# Patient Record
Sex: Male | Born: 1970 | Race: White | Hispanic: No | Marital: Married | Smoking: Never smoker
Health system: Southern US, Community
[De-identification: ages and names within clinical notes are randomized; demographics above are authoritative.]

## PROBLEM LIST (undated history)

## (undated) DIAGNOSIS — J302 Other seasonal allergic rhinitis: Secondary | ICD-10-CM

## (undated) DIAGNOSIS — T884XXA Failed or difficult intubation, initial encounter: Secondary | ICD-10-CM

## (undated) DIAGNOSIS — G4730 Sleep apnea, unspecified: Secondary | ICD-10-CM

## (undated) DIAGNOSIS — M5020 Other cervical disc displacement, unspecified cervical region: Secondary | ICD-10-CM

## (undated) DIAGNOSIS — J310 Chronic rhinitis: Secondary | ICD-10-CM

## (undated) HISTORY — PX: WISDOM TOOTH EXTRACTION: SHX21

## (undated) HISTORY — PX: NASAL SINUS SURGERY: SHX719

## (undated) HISTORY — PX: SHOULDER SURGERY: SHX246

## (undated) HISTORY — PX: COLONOSCOPY: SHX174

---

## 2008-10-01 ENCOUNTER — Ambulatory Visit: Payer: Self-pay | Admitting: Otolaryngology

## 2008-11-01 ENCOUNTER — Ambulatory Visit: Payer: Self-pay | Admitting: Otolaryngology

## 2009-03-22 ENCOUNTER — Ambulatory Visit: Payer: Self-pay | Admitting: Otolaryngology

## 2009-10-07 ENCOUNTER — Emergency Department: Payer: Self-pay | Admitting: Emergency Medicine

## 2009-10-13 ENCOUNTER — Emergency Department: Payer: Self-pay | Admitting: Emergency Medicine

## 2009-12-09 ENCOUNTER — Ambulatory Visit: Payer: Self-pay | Admitting: Otolaryngology

## 2010-01-08 ENCOUNTER — Ambulatory Visit: Payer: Self-pay | Admitting: Otolaryngology

## 2012-05-27 ENCOUNTER — Ambulatory Visit: Payer: Self-pay | Admitting: Internal Medicine

## 2012-06-10 ENCOUNTER — Ambulatory Visit: Payer: Self-pay | Admitting: Gastroenterology

## 2012-06-13 LAB — PATHOLOGY REPORT

## 2012-06-20 ENCOUNTER — Ambulatory Visit: Payer: Self-pay | Admitting: Gastroenterology

## 2012-06-22 HISTORY — PX: CHOLECYSTECTOMY: SHX55

## 2012-09-02 ENCOUNTER — Ambulatory Visit: Payer: Self-pay | Admitting: Surgery

## 2012-09-05 LAB — PATHOLOGY REPORT

## 2013-05-11 IMAGING — US ABDOMEN ULTRASOUND
2 series · 13 of 25 positions shown · non-contrast
Comparison: none

REASON FOR EXAM: Epigastric discomfort  elevated liver function test
COMMENTS:

PROCEDURE:     US  - US ABDOMEN GENERAL SURVEY  - May 27, 2012  [DATE]
RESULT:     Comparison: None
TECHNIQUE: Multiple gray-scale and color-flow Doppler images of the abdomen
are presented for review.

[Series 1: abdomen ultrasound · 0.31mm/px · 12 of 83 slices shown (1 of 2)]
[im 1/83]
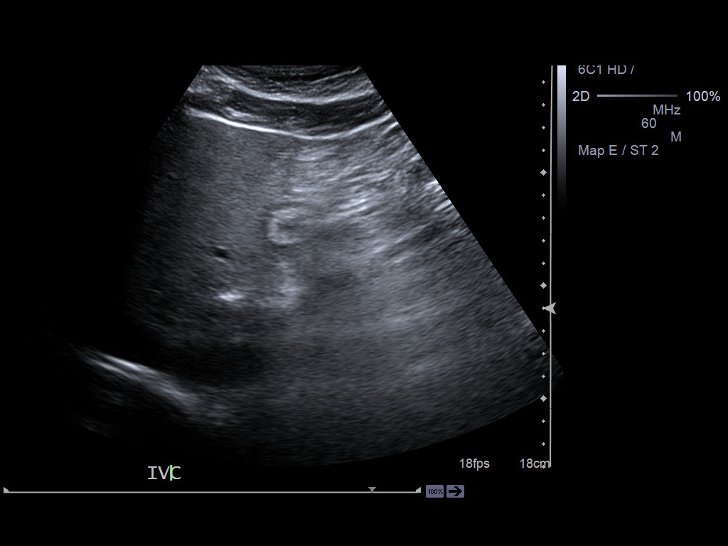
[im 8/83]
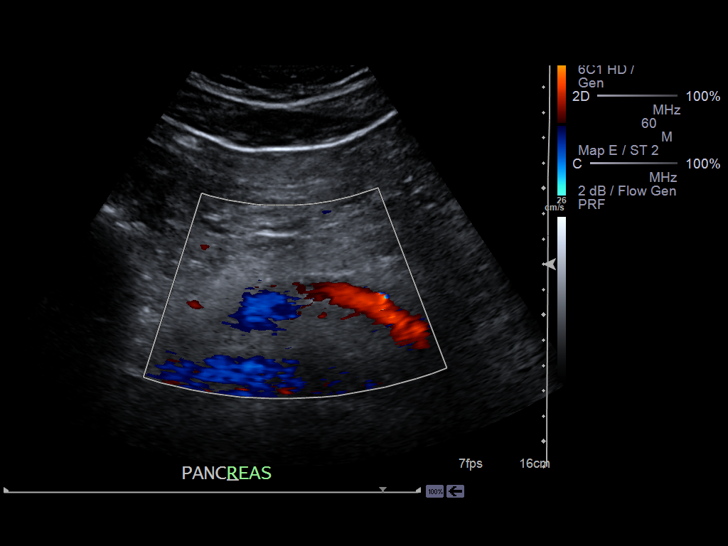
[im 15/83]
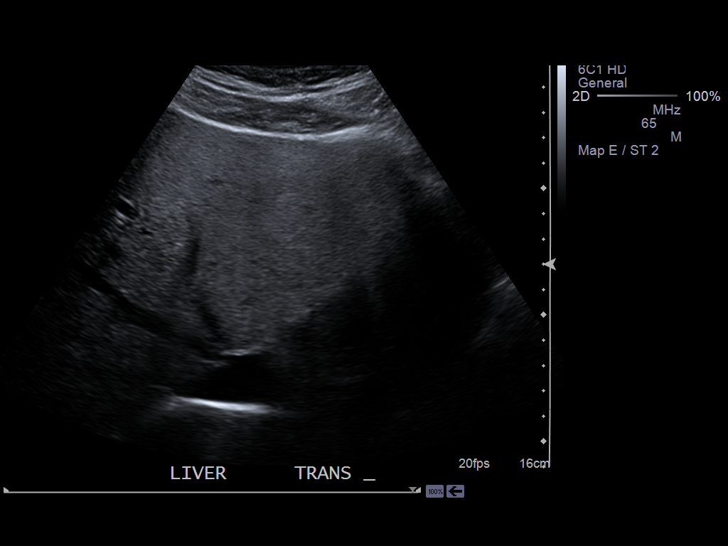
[im 22/83]
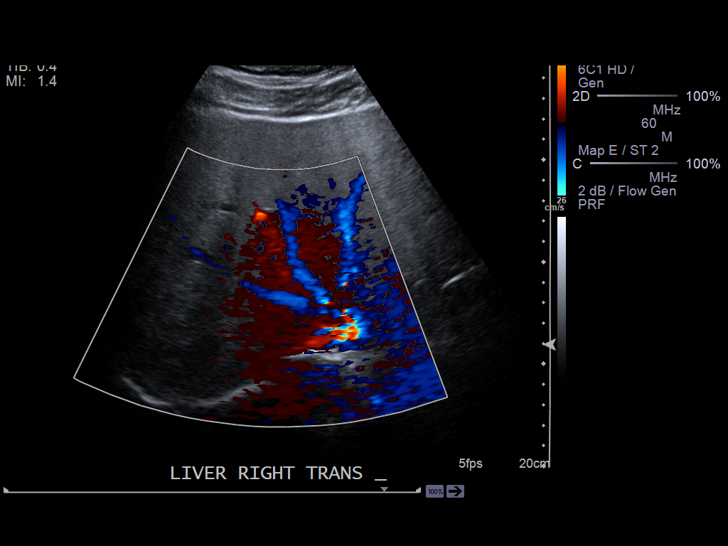
[im 29/83]
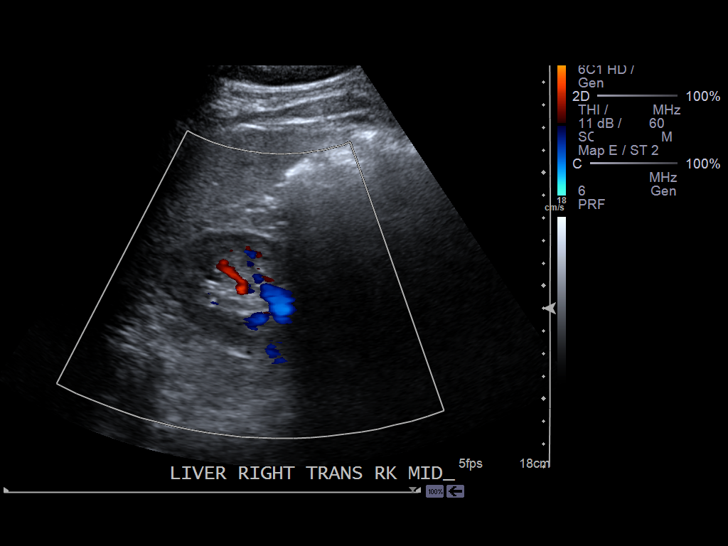
[im 36/83]
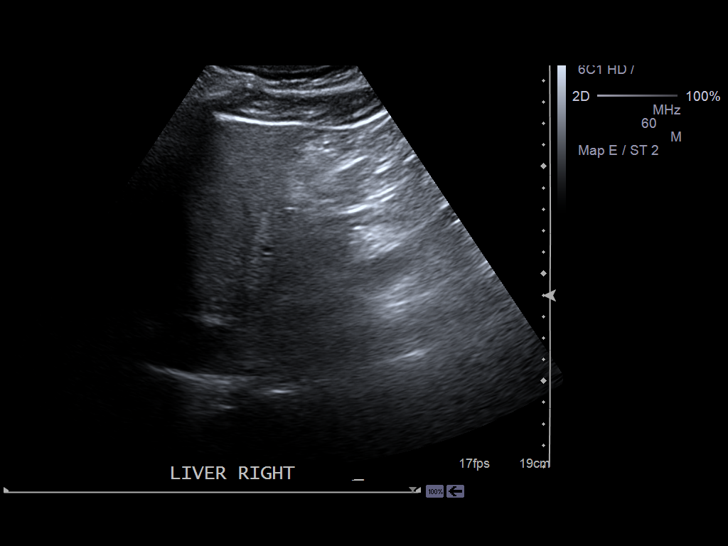
[im 43/83]
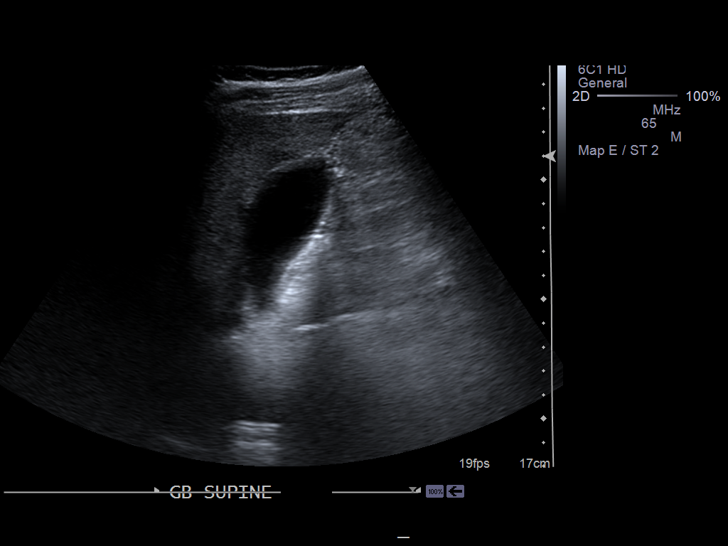
[im 50/83]
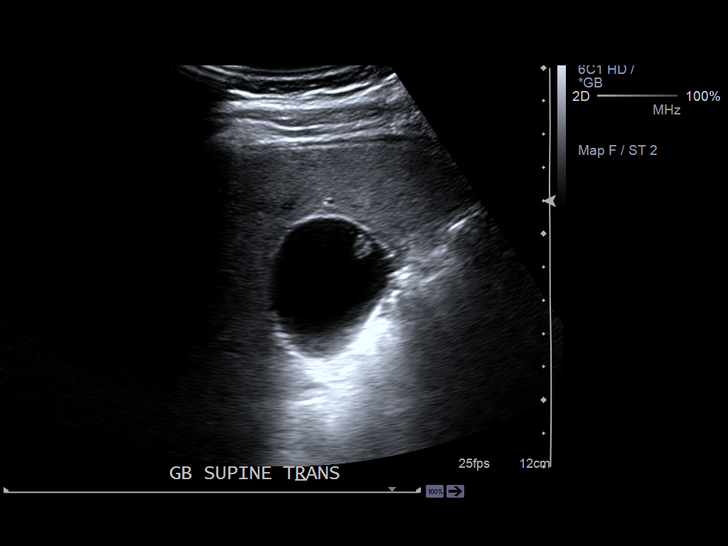
[im 58/83]
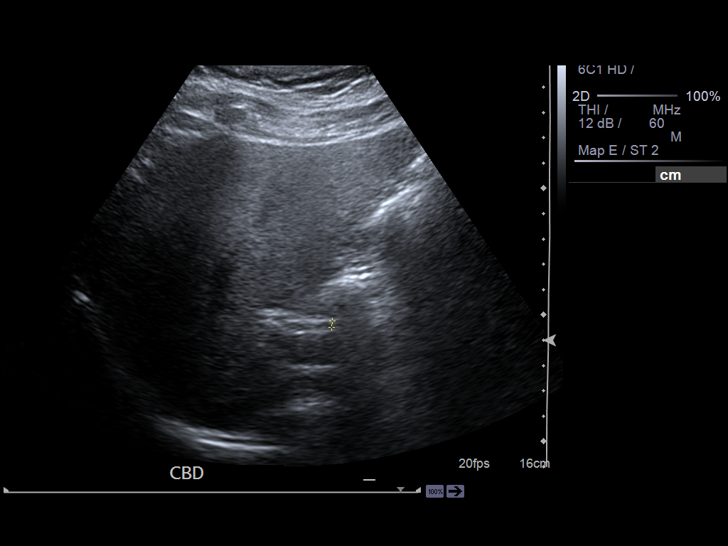
[im 65/83]
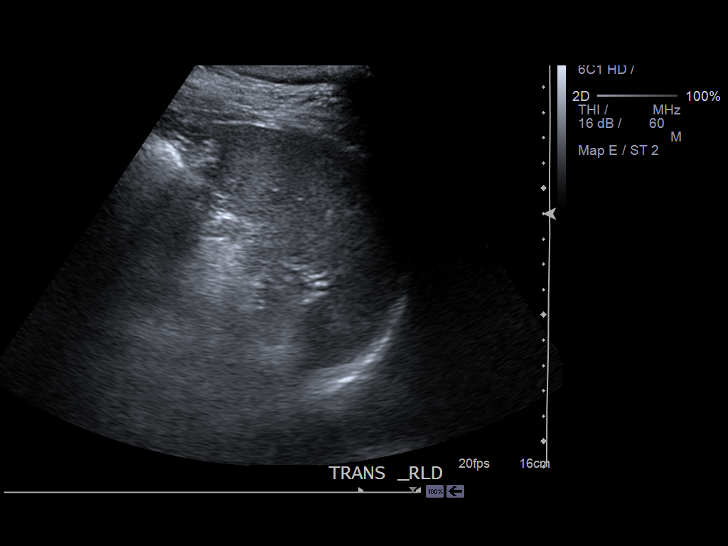
[im 72/83]
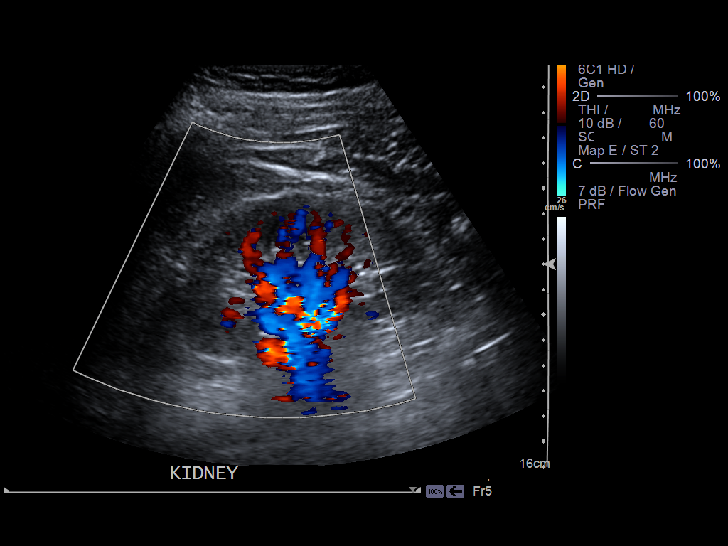
[im 79/83]
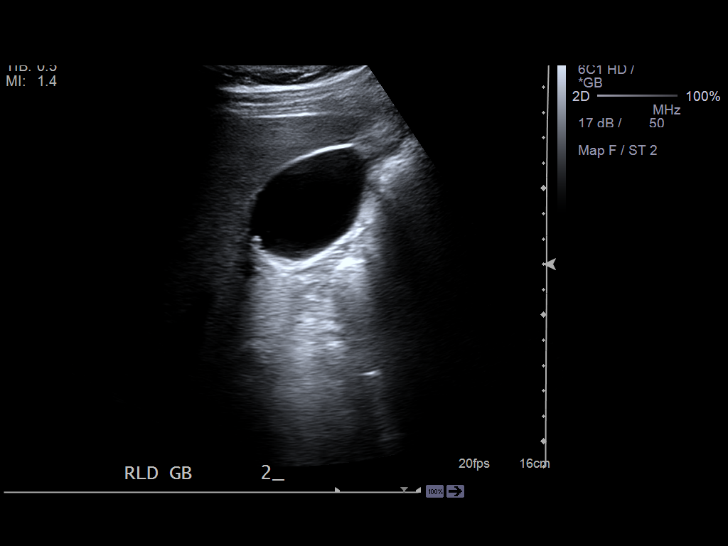

[Series 3: abdomen ultrasound · 0.25mm/px · 1 of 1 slices shown (2 of 2)]
[im 1/1]
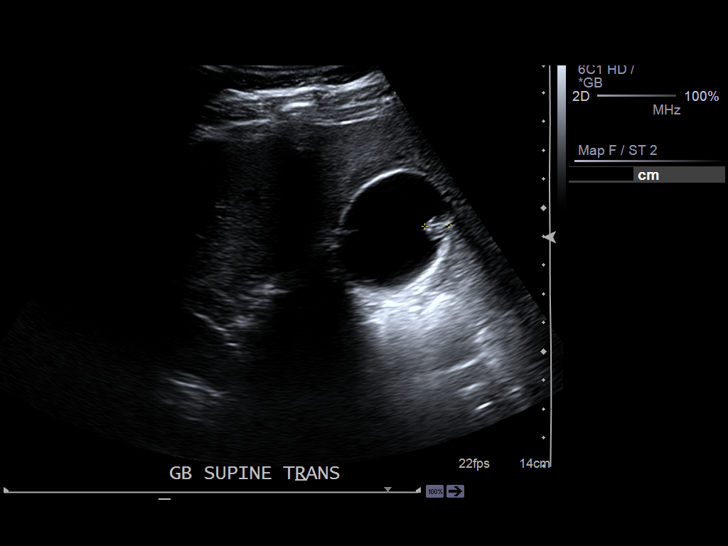

[13 of 25 positions shown; findings below may reference images not displayed]

FINDINGS: The liver is increased in echogenicity as can be seen with hepatic
steatosis. The liver is without evidence of a focal hepatic lesion.

There is no cholelithiasis or biliary sludge. The there are 2 nonmobile
echogenic foci along the nondependent aspect of the gallbladder measuring 5
mm and 9 mm respectively . There is no intra- or extrahepatic biliary ductal
dilatation. The common duct measures 2.5 mm in maximal diameter. There is no
gallbladder wall thickening, pericholecystic fluid, or sonographic Murphy's
sign.

The visualized portion of the pancreas is normal in echogenicity. The spleen
is unremarkable. Bilateral kidneys are normal in echogenicity and size. The
right kidney measures 12.4 x 4.6 x 5.8 cm. The left kidney measures 12.6 x
5.1 x 5.2 cm. There are no renal calculi or hydronephrosis. The abdominal
aorta and IVC are unremarkable.
IMPRESSION: 1. The there are 2 nonmobile echogenic foci along the nondependent aspect of
the gallbladder measuring 5 mm and 9 mm respectively a concerning for a
gallbladder polyps versus adherent gallstones. At the very least surgical
consultation and ultrasound followup in 3-6 months is recommended.

2. No cholelithiasis or sonographic evidence of acute cholecystitis.

[REDACTED]

## 2014-02-10 ENCOUNTER — Emergency Department: Payer: Self-pay | Admitting: Emergency Medicine

## 2014-10-12 NOTE — Op Note (Signed)
PATIENT NAME:  Anthony Gallegos, Anthony Gallegos MR#:  [id number] DATE OF BIRTH:  09-09-1970  DATE OF PROCEDURE:  09/02/2012  PREOPERATIVE DIAGNOSIS: Chronic cholecystitis.   POSTOPERATIVE DIAGNOSIS: Chronic cholecystitis, cholelithiasis.   PROCEDURE: Laparoscopic cholecystectomy, cholangiogram.   SURGEON: Rochel Brome, MD  ANESTHESIA: General.   INDICATIONS: This 43 year old male has history of protracted nausea and ultrasound findings of particulate debris within the gallbladder suggestive of either stones or small polyps. Surgery was recommended for definitive treatment.   DESCRIPTION OF PROCEDURE: The patient was placed on the operating table, in the supine position, under general anesthesia. The abdomen was clipped and prepared with ChloraPrep and draped in a sterile manner.   A short incision was made in the inferior aspect of the umbilicus and carried down to the deep fascia which was grasped with laryngeal hook and elevated. A Veress needle was inserted, aspirated, and irrigated with a saline solution. The peritoneal cavity was inflated with carbon dioxide. The Veress needle was removed. The 10 mm cannula was inserted. The 10 mm 0 degree laparoscope was inserted to view the peritoneal cavity. The liver appeared normal. Another incision was made in the epigastrium slightly to the right of the midline to introduce another 10 mm cannula. Two incisions were made in the lateral aspect of the right upper quadrant to introduce two 5 mm cannulas.   The gallbladder was retracted towards the right shoulder. The infundibulum was retracted inferiorly and laterally. The porta hepatis was demonstrated. The neck of the gallbladder was mobilized with incision of the visceral peritoneum. The cystic duct and cystic artery were each dissected free from surrounding structures. A critical view of safety was demonstrated. The cystic artery was controlled with double endoclips  and divided. This allowed better traction on the  cystic duct. Next, an Endo Clip was placed across the cystic duct adjacent to the neck of the gallbladder. An incision was made in the cystic duct to introduce a Reddick catheter. Half-strength Conray-60 dye was injected as the cholangiogram was done with fluoroscopy viewing the biliary tree and prompt flow of dye into the duodenum.  No retained stones were seen. The Reddick catheter was removed. The cystic duct was doubly ligated with endoclips and divided. The gallbladder was dissected free from the liver with hook and cautery. The gallbladder was completely separated. The site was irrigated with a saline solution and aspirated. Hemostasis was intact. The gallbladder was delivered up through the infraumbilical incision, opened, suctioned and removed, and was further opened and demonstrated fine particulate debris within the gallbladder consistent with tiny stones. The gallbladder was submitted in formalin for routine pathology. The right upper quadrant was further inspected. Hemostasis was intact. The cannulas were removed allowing carbon dioxide to escape from the peritoneal cavity. The umbilical defect was closed with a 0 Vicryl simple suture. The skin incisions were closed with interrupted 5-0 chromic subcuticular sutures, benzoin, and Steri-Strips. Dressings were applied with paper tape. The patient tolerated surgery satisfactorily and was prepared for transfer to the recovery room.  ____________________________ Lenna Sciara. Rochel Brome, MD jws:sb D: 09/02/2012 10:47:58 ET T: 09/02/2012 11:20:00 ET JOB#: [id number]  cc: Loreli Dollar, MD, <Dictator> Loreli Dollar MD ELECTRONICALLY SIGNED 09/04/2012 21:22

## 2015-07-19 ENCOUNTER — Ambulatory Visit: Payer: Self-pay | Admitting: Physician Assistant

## 2015-07-23 ENCOUNTER — Encounter (HOSPITAL_COMMUNITY)
Admission: RE | Admit: 2015-07-23 | Discharge: 2015-07-23 | Disposition: A | Discharge status: Home / Self Care | Payer: Worker's Compensation | Source: Ambulatory Visit | Attending: Orthopedic Surgery | Admitting: Orthopedic Surgery

## 2015-07-23 ENCOUNTER — Encounter (HOSPITAL_COMMUNITY): Payer: Self-pay

## 2015-07-23 DIAGNOSIS — M50223 Other cervical disc displacement at C6-C7 level: Secondary | ICD-10-CM | POA: Diagnosis present

## 2015-07-23 DIAGNOSIS — M50123 Cervical disc disorder at C6-C7 level with radiculopathy: Secondary | ICD-10-CM | POA: Diagnosis not present

## 2015-07-23 HISTORY — DX: Sleep apnea, unspecified: G47.30

## 2015-07-23 HISTORY — DX: Other cervical disc displacement, unspecified cervical region: M50.20

## 2015-07-23 HISTORY — DX: Failed or difficult intubation, initial encounter: T88.4XXA

## 2015-07-23 HISTORY — DX: Other seasonal allergic rhinitis: J30.2

## 2015-07-23 LAB — CBC
HEMATOCRIT: 44.8 % (ref 39.0–52.0)
Hemoglobin: 15.9 g/dL (ref 13.0–17.0)
MCH: 30.9 pg (ref 26.0–34.0)
MCHC: 35.5 g/dL (ref 30.0–36.0)
MCV: 87 fL (ref 78.0–100.0)
Platelets: 215 10*3/uL (ref 150–400)
RBC: 5.15 MIL/uL (ref 4.22–5.81)
RDW: 12.8 % (ref 11.5–15.5)
WBC: 5.2 10*3/uL (ref 4.0–10.5)

## 2015-07-23 LAB — SURGICAL PCR SCREEN
MRSA, PCR: NEGATIVE
STAPHYLOCOCCUS AUREUS: POSITIVE — AB

## 2015-07-23 NOTE — Progress Notes (Signed)
Anesthesia PAT Evaluation: Patient is a 45 year old male scheduled for C5-7 anterior disc arthroplasty on 07/25/15 by Dr. Rolena Infante.  History includes OSA with CPAP use, cholecystectomy '14 [facility]), nasal sinus surgery, non-smoker. BMI is 33. Potential for DIFFICULT INTUBATION--Doesn't recall specifically being told he was a difficult intubation, but said there was mention of him having a "low palate." (See below for anesthesia record summary.)     Has medical clearance for his PCP Dr. Frazier Richards (see Care Everywhere).  PAT Vitals: HR 57, BP 141/83, RR 20, T 36.6C, O2 sat 100%. Patient is a pleasant Caucasian male in NAD. Heart RRR, no murmur noted. Lungs clear. No carotid bruits noted. Denied CP, SOB, edema, syncope. Reported a normal stress test > 20 years ago. Mouth opening borderline small due to prominent tongue on protrusion.   Preoperative CBC WNL.  Based on his anatomy and mention of his palate after his last surgery, I told him advanced airway equipment may be used or readily available for GETA. I told him I would request his 2014 anesthesia records to have available for review. He does not recall an awake intubation history. 09/02/12 [facility] anesthesia record received and indicated patient with Grade IV view. Had IV induction and orally intubated with 7.5 ETT using 3 MAC, stylet after three attempts. Ultimately intubated by anesthesiologist Dr. Carylon Perches.    Patient has been medically cleared for surgery. Definitive anesthesia plan to be determined on the day of surgery following anesthesiologist evaluation. He will require GETA. An overnight stay is anticipated.   George Hugh [facility] Short Stay Center/Anesthesiology Phone [phone] 07/23/2015 11:18 AM

## 2015-07-23 NOTE — Progress Notes (Signed)
PCR + Staph, treat DOS with Betadine swab.

## 2015-07-23 NOTE — Progress Notes (Signed)
Pt denies SOB, chest pain, and being under the care of a cardiologist. Pt stated that a stress test was done >20 years ago and " everything was okay, it was just stress." Pt denies having an echo and cardiac cath. Spoke with Ebony Hail, [place], Anesthesia regarding order for consult; requested anesthesia records from [facility] for cholecystectomy.

## 2015-07-23 NOTE — Pre-Procedure Instructions (Signed)
    Anthony Gallegos  07/23/2015      [id number] - [place], [place] [address] [place] [address] Phone: [phone] Fax: [phone]    Your procedure is scheduled on Thursday, July 25, 2015  Report to [facility] Admitting at 10:15 A.M.  Call this number if you have problems the morning of surgery:  [phone]   Remember:  Do not eat food or drink liquids after midnight Wednesday, July 24, 2015  Take these medicines the morning of surgery with A SIP OF WATER: None Stop taking Aspirin, vitamins, fish oil and herbal medications. Do not take any NSAIDs ie: Ibuprofen, Advil, Naproxen or any medication containing Aspirin; stop now.   Do not wear jewelry, make-up or nail polish.  Do not wear lotions, powders, or perfumes.  You may not wear deodorant.  Do not shave 48 hours prior to surgery.  Men may shave face and neck.  Do not bring valuables to the hospital.  [facility] is not responsible for any belongings or valuables.  Contacts, dentures or bridgework may not be worn into surgery.  Leave your suitcase in the car.  After surgery it may be brought to your room.  For patients admitted to the hospital, discharge time will be determined by your treatment team.  Patients discharged the day of surgery will not be allowed to drive home.   Name and phone number of your driver:  Special instructions:  Shower the night before surgery and the morning of surgery with CHG.  Please read over the following fact sheets that you were given. Pain Booklet, Coughing and Deep Breathing, MRSA Information and Surgical Site Infection Prevention

## 2015-07-24 MED ORDER — CEFAZOLIN SODIUM-DEXTROSE 2-3 GM-% IV SOLR
2.0000 g | INTRAVENOUS | Status: AC
  Administered 2015-07-25: 2 g via INTRAVENOUS
  Filled 2015-07-24: qty 50

## 2015-07-25 ENCOUNTER — Ambulatory Visit (HOSPITAL_COMMUNITY): Payer: Worker's Compensation | Admitting: Certified Registered Nurse Anesthetist

## 2015-07-25 ENCOUNTER — Observation Stay (HOSPITAL_COMMUNITY): Payer: Worker's Compensation

## 2015-07-25 ENCOUNTER — Observation Stay (HOSPITAL_COMMUNITY)
Admission: RE | Admit: 2015-07-25 | Discharge: 2015-07-26 | Disposition: A | Discharge status: Home / Self Care | Payer: Worker's Compensation | Source: Ambulatory Visit | Attending: Orthopedic Surgery | Admitting: Orthopedic Surgery

## 2015-07-25 ENCOUNTER — Ambulatory Visit (HOSPITAL_COMMUNITY): Payer: Worker's Compensation

## 2015-07-25 ENCOUNTER — Ambulatory Visit (HOSPITAL_COMMUNITY): Payer: Worker's Compensation | Admitting: Vascular Surgery

## 2015-07-25 ENCOUNTER — Encounter (HOSPITAL_COMMUNITY)
Admission: RE | Disposition: A | Discharge status: Home / Self Care | Payer: Worker's Compensation | Source: Ambulatory Visit | Attending: Orthopedic Surgery

## 2015-07-25 ENCOUNTER — Encounter (HOSPITAL_COMMUNITY): Payer: Self-pay | Admitting: Certified Registered Nurse Anesthetist

## 2015-07-25 DIAGNOSIS — Z419 Encounter for procedure for purposes other than remedying health state, unspecified: Secondary | ICD-10-CM

## 2015-07-25 DIAGNOSIS — M50123 Cervical disc disorder at C6-C7 level with radiculopathy: Secondary | ICD-10-CM | POA: Diagnosis not present

## 2015-07-25 DIAGNOSIS — Z9889 Other specified postprocedural states: Secondary | ICD-10-CM

## 2015-07-25 DIAGNOSIS — M542 Cervicalgia: Secondary | ICD-10-CM | POA: Diagnosis present

## 2015-07-25 HISTORY — PX: CERVICAL DISC ARTHROPLASTY: SHX587

## 2015-07-25 SURGERY — CERVICAL ANTERIOR DISC ARTHROPLASTY
Anesthesia: General | Site: Spine Cervical

## 2015-07-25 MED ORDER — PHENYLEPHRINE HCL 10 MG/ML IJ SOLN
10.0000 mg | INTRAMUSCULAR | Status: DC | PRN
  Administered 2015-07-25: 20 ug/min via INTRAVENOUS

## 2015-07-25 MED ORDER — MIDAZOLAM HCL 2 MG/2ML IJ SOLN
INTRAMUSCULAR | Status: AC
  Filled 2015-07-25: qty 2

## 2015-07-25 MED ORDER — METHOCARBAMOL 500 MG PO TABS
500.0000 mg | ORAL_TABLET | Freq: Four times a day (QID) | ORAL | Status: DC | PRN
  Administered 2015-07-25 – 2015-07-26 (×2): 500 mg via ORAL
  Filled 2015-07-25 (×2): qty 1

## 2015-07-25 MED ORDER — LIDOCAINE HCL (CARDIAC) 20 MG/ML IV SOLN
INTRAVENOUS | Status: AC
  Filled 2015-07-25: qty 5

## 2015-07-25 MED ORDER — DEXAMETHASONE SODIUM PHOSPHATE 10 MG/ML IJ SOLN
INTRAMUSCULAR | Status: DC | PRN
  Administered 2015-07-25: 10 mg via INTRAVENOUS

## 2015-07-25 MED ORDER — OXYCODONE HCL 5 MG PO TABS
10.0000 mg | ORAL_TABLET | ORAL | Status: DC | PRN
  Administered 2015-07-25 – 2015-07-26 (×4): 10 mg via ORAL
  Filled 2015-07-25 (×3): qty 2

## 2015-07-25 MED ORDER — SUCCINYLCHOLINE CHLORIDE 20 MG/ML IJ SOLN
INTRAMUSCULAR | Status: AC
  Filled 2015-07-25: qty 1

## 2015-07-25 MED ORDER — FENTANYL CITRATE (PF) 250 MCG/5ML IJ SOLN
INTRAMUSCULAR | Status: AC
  Filled 2015-07-25: qty 5

## 2015-07-25 MED ORDER — METHOCARBAMOL 500 MG PO TABS: 500.0000 mg | ORAL_TABLET | Freq: Three times a day (TID) | ORAL | Status: AC | PRN

## 2015-07-25 MED ORDER — THROMBIN 20000 UNITS EX SOLR
CUTANEOUS | Status: AC
  Filled 2015-07-25: qty 20000

## 2015-07-25 MED ORDER — ROCURONIUM BROMIDE 50 MG/5ML IV SOLN
INTRAVENOUS | Status: AC
  Filled 2015-07-25: qty 1

## 2015-07-25 MED ORDER — BUPIVACAINE-EPINEPHRINE (PF) 0.25% -1:200000 IJ SOLN
INTRAMUSCULAR | Status: AC
  Filled 2015-07-25: qty 30

## 2015-07-25 MED ORDER — ONDANSETRON HCL 4 MG/2ML IJ SOLN
4.0000 mg | INTRAMUSCULAR | Status: DC | PRN
  Administered 2015-07-25 – 2015-07-26 (×3): 4 mg via INTRAVENOUS
  Filled 2015-07-25 (×2): qty 2

## 2015-07-25 MED ORDER — OXYCODONE-ACETAMINOPHEN 10-325 MG PO TABS: 1.0000 | ORAL_TABLET | ORAL | Status: AC | PRN

## 2015-07-25 MED ORDER — MENTHOL 3 MG MT LOZG: 1.0000 | LOZENGE | OROMUCOSAL | Status: DC | PRN

## 2015-07-25 MED ORDER — SODIUM CHLORIDE 0.9% FLUSH
3.0000 mL | Freq: Two times a day (BID) | INTRAVENOUS | Status: DC
  Administered 2015-07-26: 3 mL via INTRAVENOUS

## 2015-07-25 MED ORDER — PHENOL 1.4 % MT LIQD
1.0000 | OROMUCOSAL | Status: DC | PRN
  Administered 2015-07-25: 1 via OROMUCOSAL
  Filled 2015-07-25: qty 177

## 2015-07-25 MED ORDER — BUPIVACAINE-EPINEPHRINE 0.25% -1:200000 IJ SOLN
INTRAMUSCULAR | Status: DC | PRN
  Administered 2015-07-25: 6 mL

## 2015-07-25 MED ORDER — NEOSTIGMINE METHYLSULFATE 10 MG/10ML IV SOLN
INTRAVENOUS | Status: AC
  Filled 2015-07-25: qty 1

## 2015-07-25 MED ORDER — LACTATED RINGERS IV SOLN
INTRAVENOUS | Status: DC
  Administered 2015-07-25: 19:00:00 via INTRAVENOUS

## 2015-07-25 MED ORDER — HEMOSTATIC AGENTS (NO CHARGE) OPTIME
TOPICAL | Status: DC | PRN
  Administered 2015-07-25: 1 via TOPICAL

## 2015-07-25 MED ORDER — 0.9 % SODIUM CHLORIDE (POUR BTL) OPTIME
TOPICAL | Status: DC | PRN
  Administered 2015-07-25 (×2): 1000 mL

## 2015-07-25 MED ORDER — GLYCOPYRROLATE 0.2 MG/ML IJ SOLN
INTRAMUSCULAR | Status: AC
  Filled 2015-07-25: qty 3

## 2015-07-25 MED ORDER — OXYCODONE HCL 5 MG PO TABS
ORAL_TABLET | ORAL | Status: AC
  Filled 2015-07-25: qty 2

## 2015-07-25 MED ORDER — PROPOFOL 10 MG/ML IV BOLUS
INTRAVENOUS | Status: DC | PRN
  Administered 2015-07-25: 50 mg via INTRAVENOUS
  Administered 2015-07-25: 200 mg via INTRAVENOUS

## 2015-07-25 MED ORDER — DEXAMETHASONE SODIUM PHOSPHATE 4 MG/ML IJ SOLN
4.0000 mg | Freq: Four times a day (QID) | INTRAMUSCULAR | Status: AC
  Administered 2015-07-25 – 2015-07-26 (×3): 4 mg via INTRAVENOUS
  Filled 2015-07-25 (×3): qty 1

## 2015-07-25 MED ORDER — HYDROMORPHONE HCL 1 MG/ML IJ SOLN
INTRAMUSCULAR | Status: AC
  Administered 2015-07-25: 0.5 mg via INTRAVENOUS
  Filled 2015-07-25: qty 1

## 2015-07-25 MED ORDER — ROCURONIUM BROMIDE 100 MG/10ML IV SOLN
INTRAVENOUS | Status: DC | PRN
Start: 2015-07-25 — End: 2015-07-25
  Administered 2015-07-25: 50 mg via INTRAVENOUS

## 2015-07-25 MED ORDER — SODIUM CHLORIDE 0.9 % IV SOLN: 250.0000 mL | INTRAVENOUS | Status: DC

## 2015-07-25 MED ORDER — DEXAMETHASONE 4 MG PO TABS: 4.0000 mg | ORAL_TABLET | Freq: Four times a day (QID) | ORAL | Status: AC

## 2015-07-25 MED ORDER — ARTIFICIAL TEARS OP OINT
TOPICAL_OINTMENT | OPHTHALMIC | Status: AC
  Filled 2015-07-25: qty 3.5

## 2015-07-25 MED ORDER — ONDANSETRON HCL 4 MG/2ML IJ SOLN
INTRAMUSCULAR | Status: AC
  Filled 2015-07-25: qty 2

## 2015-07-25 MED ORDER — THROMBIN 20000 UNITS EX KIT
PACK | CUTANEOUS | Status: DC | PRN
  Administered 2015-07-25: 20 mL via TOPICAL

## 2015-07-25 MED ORDER — ONDANSETRON HCL 4 MG/2ML IJ SOLN
INTRAMUSCULAR | Status: AC
  Administered 2015-07-25: 4 mg via INTRAVENOUS
  Filled 2015-07-25: qty 2

## 2015-07-25 MED ORDER — FENTANYL CITRATE (PF) 100 MCG/2ML IJ SOLN
INTRAMUSCULAR | Status: DC | PRN
  Administered 2015-07-25: 50 ug via INTRAVENOUS
  Administered 2015-07-25 (×2): 100 ug via INTRAVENOUS
  Administered 2015-07-25: 50 ug via INTRAVENOUS

## 2015-07-25 MED ORDER — MIDAZOLAM HCL 5 MG/5ML IJ SOLN
INTRAMUSCULAR | Status: DC | PRN
  Administered 2015-07-25: 2 mg via INTRAVENOUS

## 2015-07-25 MED ORDER — LACTATED RINGERS IV SOLN
INTRAVENOUS | Status: DC
  Administered 2015-07-25 (×2): via INTRAVENOUS

## 2015-07-25 MED ORDER — FENTANYL CITRATE (PF) 250 MCG/5ML IJ SOLN
INTRAMUSCULAR | Status: AC
Start: 2015-07-25 — End: 2015-07-25
  Filled 2015-07-25: qty 5

## 2015-07-25 MED ORDER — ONDANSETRON HCL 4 MG PO TABS: 4.0000 mg | ORAL_TABLET | Freq: Three times a day (TID) | ORAL | Status: AC | PRN

## 2015-07-25 MED ORDER — DEXAMETHASONE SODIUM PHOSPHATE 10 MG/ML IJ SOLN
INTRAMUSCULAR | Status: AC
  Filled 2015-07-25: qty 3

## 2015-07-25 MED ORDER — SODIUM CHLORIDE 0.9% FLUSH: 3.0000 mL | INTRAVENOUS | Status: DC | PRN

## 2015-07-25 MED ORDER — MORPHINE SULFATE (PF) 2 MG/ML IV SOLN
1.0000 mg | INTRAVENOUS | Status: DC | PRN
  Administered 2015-07-25 – 2015-07-26 (×3): 2 mg via INTRAVENOUS
  Filled 2015-07-25 (×3): qty 1

## 2015-07-25 MED ORDER — LIDOCAINE HCL (CARDIAC) 20 MG/ML IV SOLN
INTRAVENOUS | Status: DC | PRN
  Administered 2015-07-25: 80 mg via INTRAVENOUS

## 2015-07-25 MED ORDER — SUCCINYLCHOLINE CHLORIDE 20 MG/ML IJ SOLN
INTRAMUSCULAR | Status: DC | PRN
  Administered 2015-07-25: 120 mg via INTRAVENOUS

## 2015-07-25 MED ORDER — HYDROMORPHONE HCL 1 MG/ML IJ SOLN
0.2500 mg | INTRAMUSCULAR | Status: DC | PRN
  Administered 2015-07-25: 0.5 mg via INTRAVENOUS

## 2015-07-25 MED ORDER — PROPOFOL 10 MG/ML IV BOLUS
INTRAVENOUS | Status: AC
  Filled 2015-07-25: qty 20

## 2015-07-25 MED ORDER — CEFAZOLIN SODIUM 1-5 GM-% IV SOLN
1.0000 g | Freq: Three times a day (TID) | INTRAVENOUS | Status: AC
  Administered 2015-07-25 – 2015-07-26 (×2): 1 g via INTRAVENOUS
  Filled 2015-07-25 (×2): qty 50

## 2015-07-25 MED ORDER — METHOCARBAMOL 1000 MG/10ML IJ SOLN
500.0000 mg | Freq: Four times a day (QID) | INTRAVENOUS | Status: DC | PRN
  Filled 2015-07-25: qty 5

## 2015-07-25 MED ORDER — ONDANSETRON HCL 4 MG/2ML IJ SOLN
INTRAMUSCULAR | Status: DC | PRN
Start: 2015-07-25 — End: 2015-07-25
  Administered 2015-07-25: 4 mg via INTRAVENOUS

## 2015-07-25 SURGICAL SUPPLY — 56 items
BLADE SURG ROTATE 9660 (MISCELLANEOUS) ×3 IMPLANT
BUR EGG ELITE 4.0 (BURR) IMPLANT
BUR EGG ELITE 4.0MM (BURR)
BUR MATCHSTICK NEURO 3.0 LAGG (BURR) IMPLANT
CANISTER SUCTION 2500CC (MISCELLANEOUS) ×3 IMPLANT
CLOSURE STERI-STRIP 1/2X4 (GAUZE/BANDAGES/DRESSINGS) ×1
CLSR STERI-STRIP ANTIMIC 1/2X4 (GAUZE/BANDAGES/DRESSINGS) ×2 IMPLANT
COLLAR CERV LO CONTOUR FIRM DE (SOFTGOODS) ×3 IMPLANT
CORDS BIPOLAR (ELECTRODE) ×3 IMPLANT
COVER MAYO STAND STRL (DRAPES) ×6 IMPLANT
COVER SURGICAL LIGHT HANDLE (MISCELLANEOUS) ×6 IMPLANT
CRADLE DONUT ADULT HEAD (MISCELLANEOUS) ×3 IMPLANT
DISC MOBI-C CERVICAL 15X7X15 (Neuro Prosthesis/Implant) ×9 IMPLANT
DRAPE C-ARM 42X72 X-RAY (DRAPES) ×3 IMPLANT
DRAPE C-ARMOR (DRAPES) ×3 IMPLANT
DRAPE POUCH INSTRU U-SHP 10X18 (DRAPES) ×3 IMPLANT
DRAPE SURG 17X23 STRL (DRAPES) ×3 IMPLANT
DRAPE U-SHAPE 47X51 STRL (DRAPES) ×3 IMPLANT
DRSG MEPILEX BORDER 4X4 (GAUZE/BANDAGES/DRESSINGS) ×3 IMPLANT
DURAPREP 26ML APPLICATOR (WOUND CARE) ×3 IMPLANT
ELECT COATED BLADE 2.86 ST (ELECTRODE) ×3 IMPLANT
ELECT PENCIL ROCKER SW 15FT (MISCELLANEOUS) ×3 IMPLANT
ELECT REM PT RETURN 9FT ADLT (ELECTROSURGICAL) ×3
ELECTRODE REM PT RTRN 9FT ADLT (ELECTROSURGICAL) ×1 IMPLANT
GLOVE BIOGEL PI IND STRL 6.5 (GLOVE) ×1 IMPLANT
GLOVE BIOGEL PI IND STRL 8.5 (GLOVE) ×1 IMPLANT
GLOVE BIOGEL PI INDICATOR 6.5 (GLOVE) ×2
GLOVE BIOGEL PI INDICATOR 8.5 (GLOVE) ×2
GLOVE SS BIOGEL STRL SZ 8.5 (GLOVE) ×3 IMPLANT
GLOVE SUPERSENSE BIOGEL SZ 8.5 (GLOVE) ×6
GOWN STRL REUS W/ TWL LRG LVL3 (GOWN DISPOSABLE) ×1 IMPLANT
GOWN STRL REUS W/TWL 2XL LVL3 (GOWN DISPOSABLE) ×6 IMPLANT
GOWN STRL REUS W/TWL LRG LVL3 (GOWN DISPOSABLE) ×2
KIT BASIN OR (CUSTOM PROCEDURE TRAY) ×3 IMPLANT
KIT ROOM TURNOVER OR (KITS) ×3 IMPLANT
NEEDLE SPNL 18GX3.5 QUINCKE PK (NEEDLE) ×3 IMPLANT
NS IRRIG 1000ML POUR BTL (IV SOLUTION) ×3 IMPLANT
PACK ORTHO CERVICAL (CUSTOM PROCEDURE TRAY) ×3 IMPLANT
PACK UNIVERSAL I (CUSTOM PROCEDURE TRAY) ×3 IMPLANT
PAD ARMBOARD 7.5X6 YLW CONV (MISCELLANEOUS) ×6 IMPLANT
RESTRAINT LIMB HOLDER UNIV (RESTRAINTS) ×3 IMPLANT
SPONGE INTESTINAL PEANUT (DISPOSABLE) ×3 IMPLANT
SPONGE SURGIFOAM ABS GEL 100 (HEMOSTASIS) ×3 IMPLANT
SURGIFLO W/THROMBIN 8M KIT (HEMOSTASIS) ×3 IMPLANT
SUT BONE WAX W31G (SUTURE) ×3 IMPLANT
SUT MNCRL AB 3-0 PS2 27 (SUTURE) ×3 IMPLANT
SUT SILK 3 0 TIES 17X18 (SUTURE) ×2
SUT SILK 3-0 18XBRD TIE BLK (SUTURE) ×1 IMPLANT
SUT VIC AB 2-0 CT1 18 (SUTURE) ×3 IMPLANT
SYR BULB IRRIGATION 50ML (SYRINGE) ×3 IMPLANT
SYR CONTROL 10ML LL (SYRINGE) ×3 IMPLANT
TAPE CLOTH 4X10 WHT NS (GAUZE/BANDAGES/DRESSINGS) ×3 IMPLANT
TAPE UMBILICAL COTTON 1/8X30 (MISCELLANEOUS) ×3 IMPLANT
TOWEL OR 17X24 6PK STRL BLUE (TOWEL DISPOSABLE) ×3 IMPLANT
TOWEL OR 17X26 10 PK STRL BLUE (TOWEL DISPOSABLE) ×3 IMPLANT
WATER STERILE IRR 1000ML POUR (IV SOLUTION) ×3 IMPLANT

## 2015-07-25 NOTE — Brief Op Note (Signed)
07/25/2015  4:55 PM  PATIENT:  Krystal Eaton  45 y.o. 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  PROCEDURE:  Procedure(s): CERVICAL ANTERIOR DISC ARTHROPLASTY C5 - C-7   2 LEVEL (N/A)  SURGEON:  Surgeon(s) and Role:    * Melina Schools, MD - Primary  PHYSICIAN ASSISTANT:   ASSISTANTS: Carmen Mayo   ANESTHESIA:   general  EBL:  Total I/O In: 1400 [I.V.:1400] Out: 75 [Blood:75]  BLOOD ADMINISTERED:none  DRAINS: none   LOCAL MEDICATIONS USED:  MARCAINE     SPECIMEN:  No Specimen  DISPOSITION OF SPECIMEN:  N/A  COUNTS:  YES  TOURNIQUET:  * No tourniquets in log *  DICTATION: .Other Dictation: Dictation Number [id number]  PLAN OF CARE: Admit for overnight observation  PATIENT DISPOSITION:  PACU - hemodynamically stable.

## 2015-07-25 NOTE — Transfer of Care (Signed)
Immediate Anesthesia Transfer of Care Note  Patient: Anthony Gallegos  Procedure(s) Performed: Procedure(s): CERVICAL ANTERIOR DISC ARTHROPLASTY C5 - C-7   2 LEVEL (N/A)  Patient Location: PACU  Anesthesia Type:General  Level of Consciousness: awake, alert , oriented and patient cooperative  Airway & Oxygen Therapy: Patient Spontanous Breathing and Patient connected to face mask oxygen  Post-op Assessment: Report given to RN, Post -op Vital signs reviewed and stable, Patient moving all extremities and Patient moving all extremities X 4  Post vital signs: Reviewed and stable  Last Vitals:  Filed Vitals:   07/25/15 1037  BP: 157/92  Pulse: 66  Temp: [id number] C    Complications: No apparent anesthesia complications

## 2015-07-25 NOTE — Anesthesia Procedure Notes (Signed)
Procedure Name: Intubation Date/Time: 07/25/2015 1:38 PM Performed by: Maryland Pink Pre-anesthesia Checklist: Patient identified, Emergency Drugs available, Suction available, Patient being monitored and Timeout performed Patient Re-evaluated:Patient Re-evaluated prior to inductionOxygen Delivery Method: Circle system utilized Preoxygenation: Pre-oxygenation with 100% oxygen Intubation Type: IV induction Ventilation: Mask ventilation without difficulty and Oral airway inserted - appropriate to patient size Laryngoscope Size: Glidescope and 4 Grade View: Grade I Tube type: Oral Tube size: 7.5 mm Number of attempts: 1 Placement Confirmation: ETT inserted through vocal cords under direct vision,  positive ETCO2 and breath sounds checked- equal and bilateral Secured at: 23 cm Tube secured with: Tape Dental Injury: Teeth and Oropharynx as per pre-operative assessment  Difficulty Due To: Difficulty was anticipated and Difficult Airway- due to limited oral opening Future Recommendations: Recommend- induction with short-acting agent, and alternative techniques readily available Comments: Leak in cuff of ETT, exchanged with Dr. Orene Desanctis at bedside. Cook exchange catheter and glidescope utilized to exchange ETT.

## 2015-07-25 NOTE — Anesthesia Preprocedure Evaluation (Addendum)
Anesthesia Evaluation  Patient identified by MRN, date of birth, ID band  Reviewed: Allergy & Precautions, H&P , NPO status , Patient's Chart, lab work & pertinent test results  History of Anesthesia Complications (+) DIFFICULT AIRWAY  Airway Mallampati: IV  TM Distance: >3 FB Neck ROM: Full  Mouth opening: Limited Mouth Opening  Dental no notable dental hx. (+) Teeth Intact, Dental Advisory Given   Pulmonary sleep apnea and Continuous Positive Airway Pressure Ventilation ,    Pulmonary exam normal breath sounds clear to auscultation       Cardiovascular negative cardio ROS   Rhythm:Regular Rate:Normal     Neuro/Psych negative neurological ROS  negative psych ROS   GI/Hepatic negative GI ROS, Neg liver ROS,   Endo/Other  negative endocrine ROS  Renal/GU negative Renal ROS  negative genitourinary   Musculoskeletal   Abdominal   Peds  Hematology negative hematology ROS (+)   Anesthesia Other Findings   Reproductive/Obstetrics negative OB ROS                            Anesthesia Physical Anesthesia Plan  ASA: III  Anesthesia Plan: General   Post-op Pain Management:    Induction: Intravenous  Airway Management Planned: Oral ETT and Video Laryngoscope Planned  Additional Equipment:   Intra-op Plan:   Post-operative Plan: Extubation in OR  Informed Consent: I have reviewed the patients History and Physical, chart, labs and discussed the procedure including the risks, benefits and alternatives for the proposed anesthesia with the patient or authorized representative who has indicated his/her understanding and acceptance.   Dental advisory given  Plan Discussed with: CRNA  Anesthesia Plan Comments:         Anesthesia Quick Evaluation

## 2015-07-25 NOTE — Discharge Instructions (Signed)

## 2015-07-25 NOTE — H&P (Signed)
History of Present Illness  The patient is a 45 year old male who presents today for follow up of their neck. The patient is being followed for their HNP. They are month(s) (6) out from injury (DOI 01/01/2015 "I was working inside an [employer] and got into a nest of bees, jerked my head and hit it on a metal enclosure"). Symptoms reported today include: pain (left neck and down the left arm), pain at night, aching, stiffness, pain with overhead motions, weakness (left arm) and numbness (both arms). The patient feels that they are doing poorly and report their pain level to be 7/ 10. Current treatment includes: physical therapy ("I don't think it made it better or worse"), activity modification and NSAIDs. The following medication has been used for pain control: antiinflammatory medication (Ibuprofen). The patient indicates that they have questions or concerns today regarding pain and their progress at this point. Note for "Follow-up Neck": The patient is currently working with light duty restrictions of no lifting over 10 lbs, no forceful pushing or pulling with the left arm and limit driving to one hour. The patient is at work with light duty and continues to have arm pain/numbness.  Additional reasons for visit:  Transition into care is described as the following: The patient is transitioning into care and a summary of care was reviewed .   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 Congestive Heart Failure  Maternal Grandfather. First Degree Relatives  reported Heart Disease  Maternal Grandfather. Heart disease in male family member before age 21  Rheumatoid Arthritis  Maternal Grandfather, Maternal Grandmother, Mother.  Social History Tobacco use  Never smoker.  04/04/2015 Exercise  Exercises rarely; does running / walking Living situation  live with spouse Marital status  married Children  3 Current drinker  04/04/2015: Currently drinks beer and wine only occasionally per week Current work status  working full time Tobacco / smoke exposure  04/04/2015: no No history of drug/alcohol rehab  Not under pain contract  Number of flights of stairs before winded  4-5  Medication History (Robin C. Young; 06/28/2015 8:07 AM) Claritin (10MG  Tablet, Oral) Active. Flonase Allergy Relief (50MCG/ACT Suspension, Nasal) Active. (prn) Ibuprofen (200MG  Tablet, Oral) Active. Medications Reconciled  Other Problems Sleep Apnea   Vitals 06/28/2015 8:08 AM Weight: 214 lb Height: 108in Body Surface Area: 2.94 m Body Mass Index: 12.9 kg/m  BP: 135/87 (Sitting, Right Arm, Standard)  Physical Exam General General Appearance-Not in acute distress. Orientation-Oriented X3. Build & Nutrition-Well nourished and Well developed.  Integumentary General Characteristics Surgical Scars - no surgical scar evidence of previous cervical surgery. Cervical Spine-Skin examination of the cervical spine is without deformity, skin lesions, lacerations or abrasions.  Peripheral Vascular Upper Extremity Palpation - Radial pulse - Bilateral - 2+.  Neurologic Sensation Upper Extremity - Left - sensation is diminished in the upper extremity. Reflexes Biceps Reflex - Bilateral - 1+. Brachioradialis Reflex - Bilateral - 1+. Triceps Reflex - Bilateral - 1+. Hoffman's Sign - Bilateral - Hoffman's sign not present.  He continues to have significant dysesthesias in the left C7 distribution and the right C6 distribution. The left side has become more problematic. He has a positive Lhermitte sign. Positive Spurling's sign bilaterally. Significant neck pain with palpation and range of motion, radiating into both the right C6 and the  left C7 dermatomes.  Fortunately, there is no focal motor deficits. Negative Babinski test, no clonus. Negative Hoffman sign, 1+ symmetrical deep tendon reflexes throughout.  Plan At this point in time, we have gone over the clinical exam. I have shown his wife the MRI pictures. We have had a long discussion about disc replacement arthroplasty versus fusion surgery. The patient has had two months of physical therapy and has failed to improve. We have talked about injection therapy and at this point, I do not think the steroid injections will provide long lasting positive relief of his symptoms. The patient states it has been now four plus month of significant pain and he would like to move forward with surgery. At this point, since there is no significant facet arthrosis and he does have multilevel disc disease, most noted at the 5-6 and 6-7 level, I think total disc arthroplasty is a viable treatment plan.  In reviewing the literature, I think this would probably give [place] the best chance of reducing the potential risk of further breakdown and neural compression at the 3-4 and 4-5 levels. This would allow [place] to address his current pathology at the 5-6 and 6-7 levels. The risks include infection, bleeding, nerve damage, death, stroke, paralysis, failure to heal, need for further surgery, ongoing or worse pain, loss of bowel and bladder control, throat pain, swallowing difficulties, hoarseness in the voice. If for technical reasons, I cannot get the disc to fit where it needs to, I will bailout and perform a fusion surgery. All other questions were address.

## 2015-07-25 NOTE — Op Note (Signed)
NAMEJAKOTA, Anthony Gallegos               ACCOUNT NO.:  [id number]  MEDICAL RECORD NO.:  [id number]  LOCATION:  [facility]                         FACILITY:  [facility]  PHYSICIAN:  Natale Thoma D. Rolena Infante, M.D. 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  OPERATIVE PROCEDURE:  Two level anterior cervical diskectomy and total disk arthroplasty.  INSTRUMENTATION SYSTEM USED:  Mobi-C, disk sizes were 5 x 15.  COMPLICATIONS:  None.  CONDITION:  Stable.  FIRST ASSISTANT:  Ronette Deter, [place].  HISTORY:  Anthony Gallegos is a 45 year old gentleman who presents with significant left triceps pain, numbness, and trace weakness and increasing right numbness and dysesthesias in the C6 distribution.  Attempts at conservative management had failed to alleviate his symptoms, so we elected to proceed with surgery.  All appropriate risks, benefits, and alternatives were discussed with the patient and his wife, and consent was obtained.  OPERATIVE NOTE:  The patient was brought to the operating room, placed supine on the operating table.  After successful induction of general anesthesia and endotracheal intubation, TEDs, SCDs were placed and rolled towels were placed underneath the shoulder blades.  Restraints were placed on the wrist for intraoperative traction and the anterior cervical spine was prepped and draped in a standard fashion.  Time-out was taken to confirm patient, procedure,  and all other pertinent important data.  Once this was completed, x-ray was used and lateral fluoro view was taken to localize the incision over the C6 vertebral body.  I infiltrated this incision site with 0.25% Marcaine and then made a transverse incision starting at the midline proceeding slightly to the left.  Sharp dissection was carried out down to the platysma. Platysma was sharply incised and I continued a standard Smith-Robinson approach to the anterior cervical spine, dissecting sharply medial to the sternocleidomastoid in the deep cervical fascia.  I eventually identified the omohyoid muscle.  This was sacrificed as it was the only crossing muscle.  At this line, I had good visualization nail in the remaining deep cervical and prevertebral fascia.  I swept the esophagus to the right and began using my Kittner dissectors to mobilize the remaining prevertebral fascia to expose these anterior cervical spine. Once I had this exposed, I then identified the C5-6 disk space and marked it with a needle.  Intraoperative x-ray confirmed this.  I then marked the disk and then mobilized using bipolar electrocautery, the longus colli muscle from the interbody of C5, down to the midbody of C7. This was done bilaterally.  Caspar retractors were placed.  I deflated the endotracheal cuff, expanded the retractor and then reinflated the endotracheal cuff.  I exposed the 6-7 disk space and an annulotomy was performed with a 15 blade scalpel.  Using pituitary rongeurs, I removed the bulk of the disk material.  I then placed distraction pins, using AP fluoro views to ensure I was in the center of the vertebral body.  I used this based on the spinous process.  Once the distraction pins were in place, I used my lamina spreader to open the disk space and maintain the distraction with the distraction pins.  I continued to work posteriorly using my curettes to remove the cartilaginous endplate and remove  all of the disk material.  Once I was down to the posterior lip, I used my 1 mm Kerrison to resect the osteophytes from the posterior aspect of the vertebral bodies of 6 and 7.  I then used my fine nerve hook to develop a plane underneath the posterior longitudinal ligament and then used my 1 mm Kerrison to resect the TLO.  I then worked underneath the uncovertebral joints bilaterally to undermine them and create more of a foraminotomy.  On the left hand side, I did find the osteophyte/disk herniation, I was consistent with the preoperative MRI. Once this was mobilized.  I then undercut the foramen with my 1-mm Kerrison.  I had an excellent decompression of this level.  I then trialed intervertebral spaces and elected to use a 5 x 15.  I then malleted this into position.  The first one I used I did not quite like the final resting place, so I removed it and placed a 2nd one.  At this point, I was pleased with the overall positioning in both the AP and lateral plane of this disk arthroplasty.  I then repositioned my retractors to expose the 5-6 disk space.  Using the same technique, I completed my diskectomy at this level.  I did use distraction pins, distracted the intervertebral space in the same similar fashion.  Once I had all the disk material out, I again resected the TLO and the posterior osteophytes from the vertebral bodies of 5 and 6.  At this point, with the uncovertebral joints decompressed and the central region decompressed, I placed the same size implant.  I was pleased also with the final position of this implant.  At this point, I irrigated copiously with normal saline and obtained hemostasis using bipolar electrocautery and FloSeal.  All of the retractors were removed from the wound.  In the AP plane, I took a straight AP as well as forward flexion view and I was pleased with the motion at both disk levels.  I then took a lateral in the neutral and forward flexed position  and I was pleased with these positions and also took the neck through a live fluoro, flexion view and the grafts were functioning adequately.  At this point, I was pleased with their positioning.  I irrigated copiously with normal saline and made sure I had complete hemostasis.  Once this was confirmed, I returned the esophagus and trachea to midline, closed the platysma with interrupted 2-0 Vicryl sutures and the skin with 3-0 Monocryl.  Steri-Strips and dry dressing were applied and the patient was ultimately extubated, transferred to PACU without incident.  At the end of the case, all needle and sponge counts were correct.  There were no adverse intraoperative events.  First Environmental consultant, again was, [employer], my PA.     Matthewjames Petrasek D. Rolena Infante, M.D.     DDB/MEDQ  D:  07/25/2015  T:  07/25/2015  Job:  [id number]  cc:   Duane Lope D. Rolena Infante, M.D.'s Office

## 2015-07-26 ENCOUNTER — Encounter (HOSPITAL_COMMUNITY): Payer: Self-pay | Admitting: Orthopedic Surgery

## 2015-07-26 DIAGNOSIS — M50123 Cervical disc disorder at C6-C7 level with radiculopathy: Secondary | ICD-10-CM | POA: Diagnosis not present

## 2015-07-26 MED FILL — Thrombin For Soln 20000 Unit: CUTANEOUS | Qty: 1 | Status: AC

## 2015-07-26 NOTE — Evaluation (Signed)
Occupational Therapy Evaluation Patient Details Name: Anthony Gallegos MRN: [id number] DOB: 1970/11/18 Today's Date: 07/26/2015    History of Present Illness Pt is a 45 y.o. male s/p CERVICAL ANTERIOR DISC ARTHROPLASTY C5 - C-7.    Clinical Impression   Pt reports he was independent with ADLs and mobility PTA. Currently pt overall supervision for safety with ADLs and functional mobility. All cervical, ADL, and safety education complete; pt and wife verbalize understanding. Pt planning to d/c home with 24/7 supervision from his wife initially. Pt ready to d/c from OT standpoint; signing off at this time. Thank you for this referral.    Follow Up Recommendations  No OT follow up;Supervision - Intermittent    Equipment Recommendations  None recommended by OT    Recommendations for Other Services PT consult     Precautions / Restrictions Precautions Precautions: Cervical Precaution Comments: Provided with handout and reviewed precautions with pt and wife. Required Braces or Orthoses: Cervical Brace Cervical Brace: Soft collar Restrictions Weight Bearing Restrictions: No      Mobility Bed Mobility Overal bed mobility: Needs Assistance Bed Mobility: Rolling;Sidelying to Sit Rolling: Supervision Sidelying to sit: Supervision       General bed mobility comments: Supervision for safety. VCs throughout for technique and hand placement. HOB flat, no use of rails.  Transfers Overall transfer level: Needs assistance Equipment used: None Transfers: Sit to/from Stand Sit to Stand: Supervision         General transfer comment: Supervision for safety. Sit to stand from EOB x2, toilet x1.    Balance Overall balance assessment: No apparent balance deficits (not formally assessed)                                          ADL Overall ADL's : Needs assistance/impaired Eating/Feeding: Set up;Sitting   Grooming: Supervision/safety;Wash/dry  face;Standing Grooming Details (indicate cue type and reason): Educated on cup for oral care. Upper Body Bathing: Set up;Sitting   Lower Body Bathing: Supervison/ safety;Sit to/from stand   Upper Body Dressing : Set up;Sitting   Lower Body Dressing: Supervision/safety;Sit to/from stand Lower Body Dressing Details (indicate cue type and reason): Pt able to cross foot over opposite knee. Wife available to assist as needed. Educated on compensatory strategies to maintain cervical precautions. Toilet Transfer: Supervision/safety;Ambulation;Regular Toilet   Toileting- Water quality scientist and Hygiene: Supervision/safety;Sit to/from stand   Tub/ Shower Transfer: Supervision/safety;Ambulation;Shower seat   Functional mobility during ADLs: Supervision/safety General ADL Comments: Pts wife present for OT eval. Educated on home safety, supervision with shower transfer, keeping frequently used items at counter top height, maintaining preautions during functional activities. Recommended sitting for shower, taking a second in standing to let dizziness clear prior to mobility; pt verbalized understanding.     Vision     Perception     Praxis      Pertinent Vitals/Pain Pain Assessment: 0-10 Pain Score: 6  Pain Location: neck, upper back Pain Descriptors / Indicators: Nagging;Sore Pain Intervention(s): Limited activity within patient's tolerance;Monitored during session;Repositioned     Hand Dominance     Extremity/Trunk Assessment Upper Extremity Assessment Upper Extremity Assessment: RUE deficits/detail;LUE deficits/detail RUE Deficits / Details: ROM and strength overall WFL. Pt reports minor tingling in finger tips. LUE Deficits / Details: ROM and strength overall WFL. Pt reports minor tingling in finger tips.   Lower Extremity Assessment Lower Extremity Assessment: Overall WFL for tasks assessed  Cervical / Trunk Assessment Cervical / Trunk Assessment: Normal   Communication  Communication Communication: No difficulties   Cognition Arousal/Alertness: Awake/alert Behavior During Therapy: WFL for tasks assessed/performed Overall Cognitive Status: Within Functional Limits for tasks assessed                     General Comments       Exercises       Shoulder Instructions      Home Living Family/patient expects to be discharged to:: Private residence Living Arrangements: Spouse/significant other;Children Available Help at Discharge: Family;Available 24 hours/day (initially) Type of Home: House Home Access: Stairs to enter [employer] of Steps: 3   Home Layout: Two level;Able to live on main level with bedroom/bathroom     Bathroom Shower/Tub: Occupational psychologist: Standard     Home Equipment: Shower seat - built in;Grab bars - tub/shower          Prior Functioning/Environment Level of Independence: Independent             OT Diagnosis: Generalized weakness;Acute pain   OT Problem List:     OT Treatment/Interventions:      OT Goals(Current goals can be found in the care plan section) Acute Rehab OT Goals Patient Stated Goal: home and return to PLOF OT Goal Formulation: All assessment and education complete, DC therapy  OT Frequency:     Barriers to D/C:            Co-evaluation              End of Session Equipment Utilized During Treatment: Gait belt;Cervical collar  Activity Tolerance: Patient tolerated treatment well Patient left: in chair;with call bell/phone within reach;with family/visitor present   Time: [id number] OT Time Calculation (min): 23 min Charges:  OT General Charges $OT Visit: 1 Procedure OT Evaluation $OT Eval Moderate Complexity: 1 Procedure OT Treatments $Self Care/Home Management : 8-22 mins G-Codes:     Binnie Kand M.S., OTR/L Pager: [phone]  07/26/2015, 8:20 AM

## 2015-07-26 NOTE — Progress Notes (Signed)
    Subjective: Procedure(s) (LRB): CERVICAL ANTERIOR DISC ARTHROPLASTY C5 - C-7   2 LEVEL (N/A) 1 Day Post-Op  Patient reports pain as 2 on 0-10 scale.  Reports decreased arm pain reports incisional neck pain   Positive void Negative bowel movement Positive flatus Negative chest pain or shortness of breath  Objective: Vital signs in last 24 hours: Temp:  [97.9 F (36.6 C)-99.1 F (37.3 C)] 98.4 F (36.9 C) (02/03 0534) Pulse Rate:  [66-98] 97 (02/03 0534) Resp:  [14-20] 18 (02/03 0534) BP: (109-157)/(59-94) 109/59 mmHg (02/03 0534) SpO2:  [93 %-100 %] 94 % (02/03 0534) Weight:  [99.338 kg (219 lb)] 99.338 kg (219 lb) (02/02 1037)  Intake/Output from previous day: 02/02 0701 - 02/03 0700 In: 1400 [I.V.:1400] Out: 875 [Urine:800; Blood:75]  Labs:  Recent Labs  07/23/15 1035  WBC 5.2  RBC 5.15  HCT 44.8  PLT 215   No results for input(s): NA, K, CL, CO2, BUN, CREATININE, GLUCOSE, CALCIUM in the last 72 hours. No results for input(s): LABPT, INR in the last 72 hours.  Physical Exam: Neurologically intact Neurovascular intact Intact pulses distally Incision: dressing C/D/I Compartment soft  Assessment/Plan: Patient stable  xrays satisfactory Mobilization with physical therapy Encourage incentive spirometry Continue care  Advance diet Up with therapy Plan for discharge tomorrow    Melina Schools, MD [place] [phone]

## 2015-07-26 NOTE — Progress Notes (Signed)
D/C orders received, pt for D/C home today.  IV and telemetry D/C.  Rx and D/C instructions given with verbalized understanding.  Family at bedside to assist with D/C.  Staff brought pt downstairs via wheelchair.  

## 2015-07-26 NOTE — Anesthesia Postprocedure Evaluation (Signed)
Anesthesia Post Note  Patient: LACHLAN SPEIR  Procedure(s) Performed: Procedure(s) (LRB): CERVICAL ANTERIOR DISC ARTHROPLASTY C5 - C-7   2 LEVEL (N/A)  Patient location during evaluation: PACU Anesthesia Type: General Level of consciousness: awake and alert Pain management: pain level controlled Vital Signs Assessment: post-procedure vital signs reviewed and stable Respiratory status: spontaneous breathing, nonlabored ventilation, respiratory function stable and patient connected to nasal cannula oxygen Cardiovascular status: blood pressure returned to baseline and stable Postop Assessment: no signs of nausea or vomiting Anesthetic complications: no    Last Vitals:  Filed Vitals:   07/26/15 0534 07/26/15 1023  BP: 109/59 122/73  Pulse: 97 73  Temp: 36.9 C 36.9 C  Resp: 18 18    Last Pain:  Filed Vitals:   07/26/15 1122  PainSc: 5                  Jaizon Deroos,JAMES TERRILL

## 2015-07-26 NOTE — Evaluation (Signed)
Physical Therapy Evaluation and Discharge Patient Details Name: Anthony Gallegos MRN: [id number] DOB: March 25, 1971 Today's Date: 07/26/2015   History of Present Illness  Pt is a 45 y.o. male s/p CERVICAL ANTERIOR DISC ARTHROPLASTY C5 - C-7.   Clinical Impression  Patient evaluated by Physical Therapy with no further acute PT needs identified. All education has been completed and the patient has no further questions. PT is signing off. Thank you for this referral.     Follow Up Recommendations No PT follow up    Equipment Recommendations  None recommended by PT    Recommendations for Other Services       Precautions / Restrictions Precautions Precautions: Cervical Precaution Comments: pt/wife able to recall precautions; vc to adhere with bed mobility Required Braces or Orthoses: Cervical Brace Cervical Brace: Soft collar;For comfort (per pt MD said he could take it off if wanted) Restrictions Weight Bearing Restrictions: No      Mobility  Bed Mobility Overal bed mobility: Needs Assistance Bed Mobility: Rolling;Sidelying to Sit Rolling: Supervision Sidelying to sit: Modified independent (Device/Increase time)       General bed mobility comments: pt began to pull to sitting from supine; vc for technique  Transfers Overall transfer level: Modified independent Equipment used: None Transfers: Sit to/from Stand Sit to Stand: Modified independent (Device/Increase time)         General transfer comment: no difficulty; pt takes incr time to assure he's not dizzy  Ambulation/Gait Ambulation/Gait assistance: Supervision;Modified independent (Device/Increase time) Ambulation Distance (Feet): 200 Feet Assistive device: None Gait Pattern/deviations: WFL(Within Functional Limits)   Gait velocity interpretation: at or above normal speed for age/gender General Gait Details: limited arm swing, otherwise WNL  Stairs Stairs: Yes Stairs assistance: Min guard Stair Management:  One rail Left;Alternating pattern;Forwards Number of Stairs: 5 General stair comments: discussed option of 2 step entry no rails vs 7 steps with rail; due to neck collar obstructing his ability to look down at steps, recommend entry with rail  Wheelchair Mobility    Modified Rankin (Stroke Patients Only)       Balance Overall balance assessment: No apparent balance deficits (not formally assessed)                                           Pertinent Vitals/Pain Pain Assessment: Faces Pain Score: 6  Faces Pain Scale: Hurts a little bit Pain Location: neck Pain Descriptors / Indicators: Operative site guarding Pain Intervention(s): Limited activity within patient's tolerance    Home Living Family/patient expects to be discharged to:: Private residence Living Arrangements: Spouse/significant other;Children Available Help at Discharge: Family;Available 24 hours/day (initially) Type of Home: House Home Access: Stairs to enter Entrance Stairs-Rails: Left Entrance Stairs-Number of Steps: 7 Home Layout: Two level;Able to live on main level with bedroom/bathroom Home Equipment: Shower seat - built in;Grab bars - tub/shower      Prior Function Level of Independence: Independent               Hand Dominance        Extremity/Trunk Assessment   Upper Extremity Assessment: Defer to OT evaluation      .   Lower Extremity Assessment: Overall WFL for tasks assessed      Cervical / Trunk Assessment: Normal  Communication   Communication: No difficulties  Cognition Arousal/Alertness: Awake/alert Behavior During Therapy: WFL for tasks assessed/performed Overall Cognitive Status: Within  Functional Limits for tasks assessed                      General Comments General comments (skin integrity, edema, etc.): wife present    Exercises        Assessment/Plan    PT Assessment Patent does not need any further PT services  PT Diagnosis  Acute pain   PT Problem List    PT Treatment Interventions     PT Goals (Current goals can be found in the Care Plan section) Acute Rehab PT Goals Patient Stated Goal: home and return to PLOF PT Goal Formulation: All assessment and education complete, DC therapy    Frequency     Barriers to discharge        Co-evaluation               End of Session Equipment Utilized During Treatment: Cervical collar;Gait belt Activity Tolerance: Patient tolerated treatment well Patient left: with family/visitor present (up in bathroom)      Functional Assessment Tool Used: clinical judgement Functional Limitation: Mobility: Walking and moving around Mobility: Walking and Moving Around Current Status [id number]): At least 1 percent but less than 20 percent impaired, limited or restricted Mobility: Walking and Moving Around Goal Status [phone]): At least 1 percent but less than 20 percent impaired, limited or restricted Mobility: Walking and Moving Around Discharge Status [phone]): At least 1 percent but less than 20 percent impaired, limited or restricted    Time: 1112-1125 PT Time Calculation (min) (ACUTE ONLY): 13 min   Charges:   PT Evaluation $PT Eval Low Complexity: 1 Procedure     PT G Codes:   PT G-Codes **NOT FOR INPATIENT CLASS** Functional Assessment Tool Used: clinical judgement Functional Limitation: Mobility: Walking and moving around Mobility: Walking and Moving Around Current Status [id number]): At least 1 percent but less than 20 percent impaired, limited or restricted Mobility: Walking and Moving Around Goal Status [phone]): At least 1 percent but less than 20 percent impaired, limited or restricted Mobility: Walking and Moving Around Discharge Status [phone]): At least 1 percent but less than 20 percent impaired, limited or restricted    Arwyn Besaw 07/26/2015, 11:39 AM Pager [phone]

## 2015-07-26 NOTE — Care Management Note (Signed)
Case Management Note  Patient Details  Name: Anthony Gallegos MRN: [id number] Date of Birth: [birth year]  Subjective/Objective:                    Action/Plan: Patient was hospitalized for a Two level anterior cervical diskectomy and total disk arthroplasty. Lives at home with spouse. Will follow for discharge needs pending PT/OT evals and physician orders.  Expected Discharge Date:                  Expected Discharge Plan:     In-House Referral:     Discharge planning Services     Post Acute Care Choice:    Choice offered to:     DME Arranged:    DME Agency:     HH Arranged:    HH Agency:     Status of Service:  In process, will continue to follow  Medicare Important Message Given:    Date Medicare IM Given:    Medicare IM give by:    Date Additional Medicare IM Given:    Additional Medicare Important Message give by:     If discussed at [place] of Stay Meetings, dates discussed:    Additional CommentsRolm Baptise, RN 07/26/2015, 11:08 AM [phone]

## 2015-07-30 ENCOUNTER — Encounter (HOSPITAL_COMMUNITY): Payer: Self-pay | Admitting: Orthopedic Surgery

## 2015-07-31 NOTE — Progress Notes (Signed)
Late entry for missed gcode    08-18-15 0700  OT G-codes **NOT FOR INPATIENT CLASS**  Functional Assessment Tool Used Clinical judgement  Functional Limitation Self care  Self Care Current Status [phone]) CI  Self Care Goal Status [id number]) CI  Self Care Discharge Status [phone]) CI   Truman Hayward, M.S., OTR/L Pager: [phone]

## 2015-08-04 NOTE — Discharge Summary (Signed)
Patient ID: Anthony Gallegos MRN: [id number] DOB/AGE: Jan 06, 1971 45 y.o.  Admit date: 07/25/2015 Discharge date: 08/04/2015  Admission Diagnoses:  Active Problems:   Neck pain   Discharge Diagnoses:  Active Problems:   Neck pain  status post Procedure(s): CERVICAL ANTERIOR DISC ARTHROPLASTY C5 - C-7   2 LEVEL  Past Medical History  Diagnosis Date  . Sleep apnea     wears CPAP  . Cervical disc herniation     with nerve compression 5-7  . Seasonal allergies   . Difficult intubation     He doesn't recall being told he was a difficult intubation. 09/02/12 [place] records indicate Grade IV view, successful after 3 attempts using [facility]    Surgeries: Procedure(s): CERVICAL ANTERIOR DISC ARTHROPLASTY C5 - C-7   2 LEVEL on 07/25/2015   Consultants:    Discharged Condition: Improved  Hospital Course: Anthony Gallegos is an 45 y.o. male who was admitted 07/25/2015 for operative treatment of neck pain and radicular arm pain. Patient failed conservative treatments (please see the history and physical for the specifics) and had severe unremitting pain that affects sleep, daily activities and work/hobbies. After pre-op clearance, the patient was taken to the operating room on 07/25/2015 and underwent  Procedure(s): CERVICAL ANTERIOR DISC ARTHROPLASTY C5 - C-7   2 LEVEL.  Discharged on 07/26/15.  Patient was given perioperative antibiotics:  Anti-infectives    Start     Dose/Rate Route Frequency Ordered Stop   07/25/15 2200  ceFAZolin (ANCEF) IVPB 1 g/50 mL premix     1 g 100 mL/hr over 30 Minutes Intravenous Every 8 hours 07/25/15 1908 07/26/15 0641   07/24/15 1254  ceFAZolin (ANCEF) IVPB 2 g/50 mL premix     2 g 100 mL/hr over 30 Minutes Intravenous 30 min pre-op 07/24/15 1254 07/25/15 1359       Patient was given sequential compression devices and early ambulation to prevent DVT.   Patient benefited maximally from hospital stay and there were no complications. At the time of discharge, the  patient was urinating/moving their bowels without difficulty, tolerating a regular diet, pain is controlled with oral pain medications and they have been cleared by PT/OT.   Recent vital signs: No data found.    Recent laboratory studies: No results for input(s): WBC, HGB, HCT, PLT, NA, K, CL, CO2, BUN, CREATININE, GLUCOSE, INR, CALCIUM in the last 72 hours.  Invalid input(s): PT, 2   Discharge Medications:     Medication List    TAKE these medications        methocarbamol 500 MG tablet  Commonly known as:  ROBAXIN  Take 1 tablet (500 mg total) by mouth 3 (three) times daily as needed for muscle spasms.     ondansetron 4 MG tablet  Commonly known as:  ZOFRAN  Take 1 tablet (4 mg total) by mouth every 8 (eight) hours as needed for nausea or vomiting.     oxyCODONE-acetaminophen 10-325 MG tablet  Commonly known as:  PERCOCET  Take 1 tablet by mouth every 4 (four) hours as needed for pain.        Diagnostic Studies: Dg Cervical Spine 2 Or 3 Views  07/25/2015  CLINICAL DATA:  Status post arthroplasty of C5 through C7. EXAM: CERVICAL SPINE - 2-3 VIEW COMPARISON:  None. FINDINGS: There is no malalignment. Intervertebral hardware is identified at C5-6, and C6-7. IMPRESSION: Postsurgical changes from C5 through C7 as described. Electronically Signed   By: Mallie Darting.D.  On: 07/25/2015 18:14   Dg Cervical Spine 2-3 Views  07/25/2015  CLINICAL DATA:  ACDF of C5-6 and C6-7. EXAM: CERVICAL SPINE - 2-3 VIEW; DG C-ARM GT 120 MIN COMPARISON:  None FINDINGS: Six images from intraoperative C-arm radiography show placement of interbody disc device at C5-6 and C6-7. Anterior screws are placed within the C5 and C6 vertebra. IMPRESSION: 1. Status post disc replacement at C5-6 and C6-7. Electronically Signed   By: Kerby Moors M.D.   On: 07/25/2015 17:25   Dg C-arm Gt 120 Min  07/25/2015  CLINICAL DATA:  ACDF of C5-6 and C6-7. EXAM: CERVICAL SPINE - 2-3 VIEW; DG C-ARM GT 120 MIN COMPARISON:   None FINDINGS: Six images from intraoperative C-arm radiography show placement of interbody disc device at C5-6 and C6-7. Anterior screws are placed within the C5 and C6 vertebra. IMPRESSION: 1. Status post disc replacement at C5-6 and C6-7. Electronically Signed   By: Kerby Moors M.D.   On: 07/25/2015 17:25          Follow-up Information    Follow up with Dahlia Bailiff, MD. Schedule an appointment as soon as possible for a visit in 2 weeks.   Specialty:  Orthopedic Surgery   Why:  If symptoms worsen, For suture removal, For wound re-check.Appt scheduled per Hoyle Sauer 08/09/15 at 9:15am   Contact information:   [address] [place] [address] [phone]       Discharge Plan:  discharge to home  Disposition:     Signed: Valinda Hoar for Dr. Melina Schools [facility] [phone] 08/04/2015, 8:24 PM

## 2016-07-01 ENCOUNTER — Telehealth: Payer: Worker's Compensation | Admitting: Nurse Practitioner

## 2016-07-01 DIAGNOSIS — R05 Cough: Secondary | ICD-10-CM

## 2016-07-01 DIAGNOSIS — R059 Cough, unspecified: Secondary | ICD-10-CM

## 2016-07-01 MED ORDER — AZITHROMYCIN 250 MG PO TABS: ORAL_TABLET | ORAL | 0 refills | Status: AC

## 2016-07-01 MED ORDER — BENZONATATE 100 MG PO CAPS: 100.0000 mg | ORAL_CAPSULE | Freq: Three times a day (TID) | ORAL | 0 refills | Status: AC | PRN

## 2016-07-01 NOTE — Progress Notes (Signed)

## 2016-07-08 IMAGING — RF DG CERVICAL SPINE 2 OR 3 VIEWS
1 series · 6 of 6 positions shown · non-contrast
Comparison: None

CLINICAL DATA: ACDF of C5-6 and C6-7.

EXAM:
CERVICAL SPINE - 2-3 VIEW; DG C-ARM GT 120 MIN

[Series 1: run · 6 of 6 slices shown]
[im 1/6]
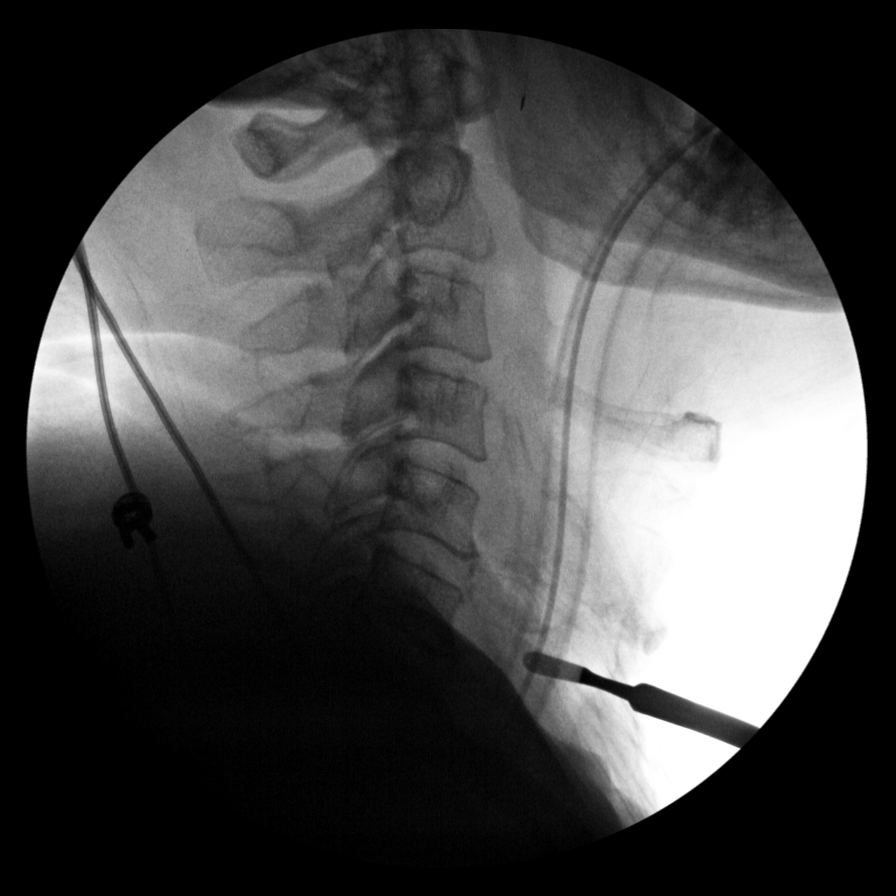
[im 2/6]
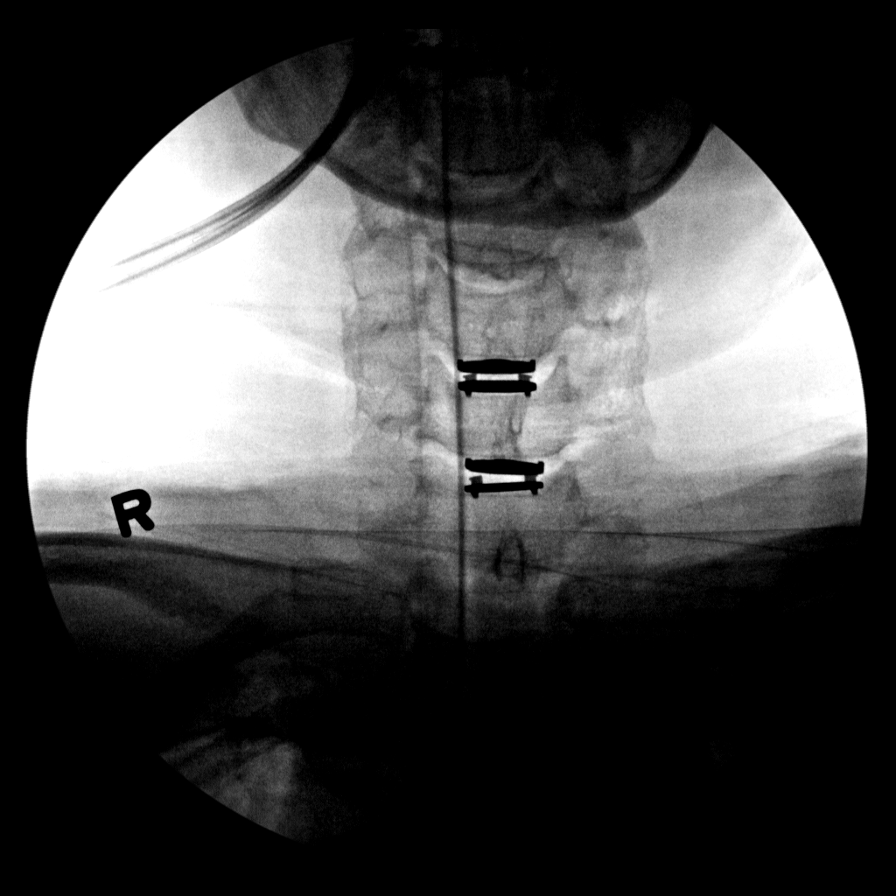
[im 3/6]
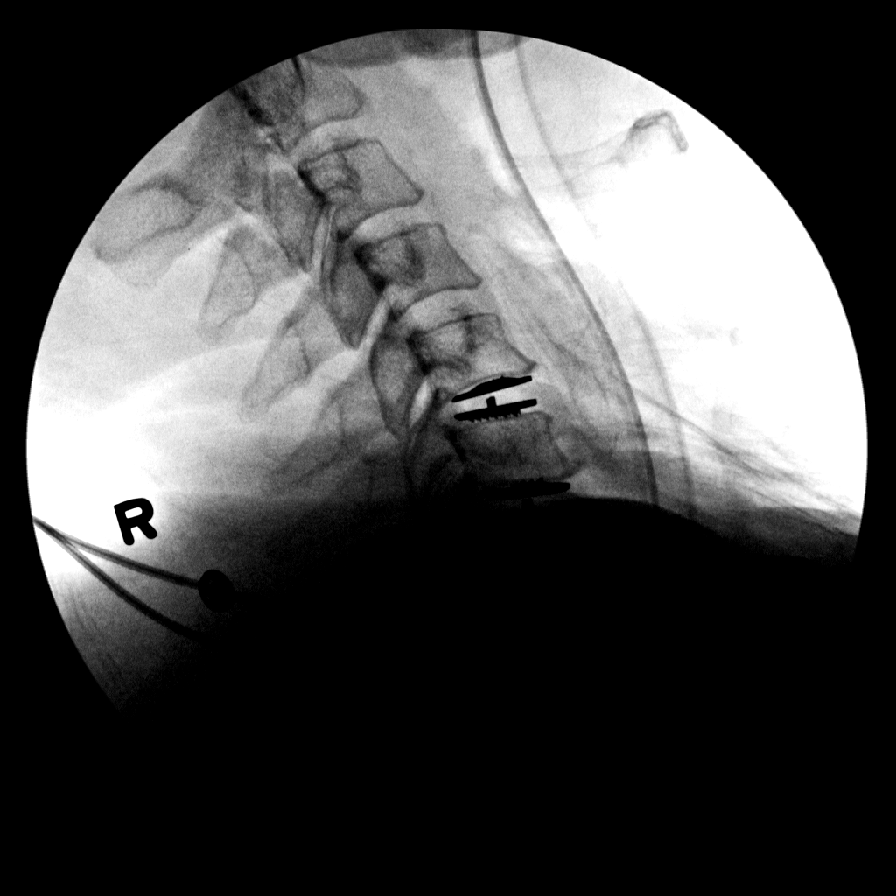
[im 4/6]
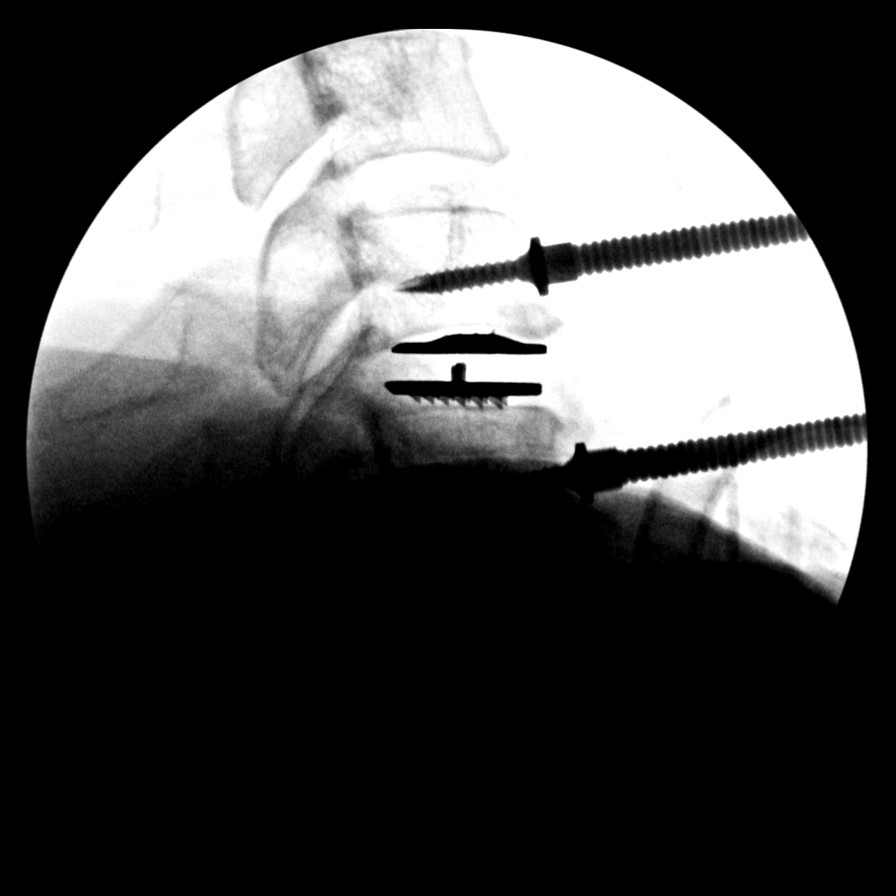
[im 5/6]
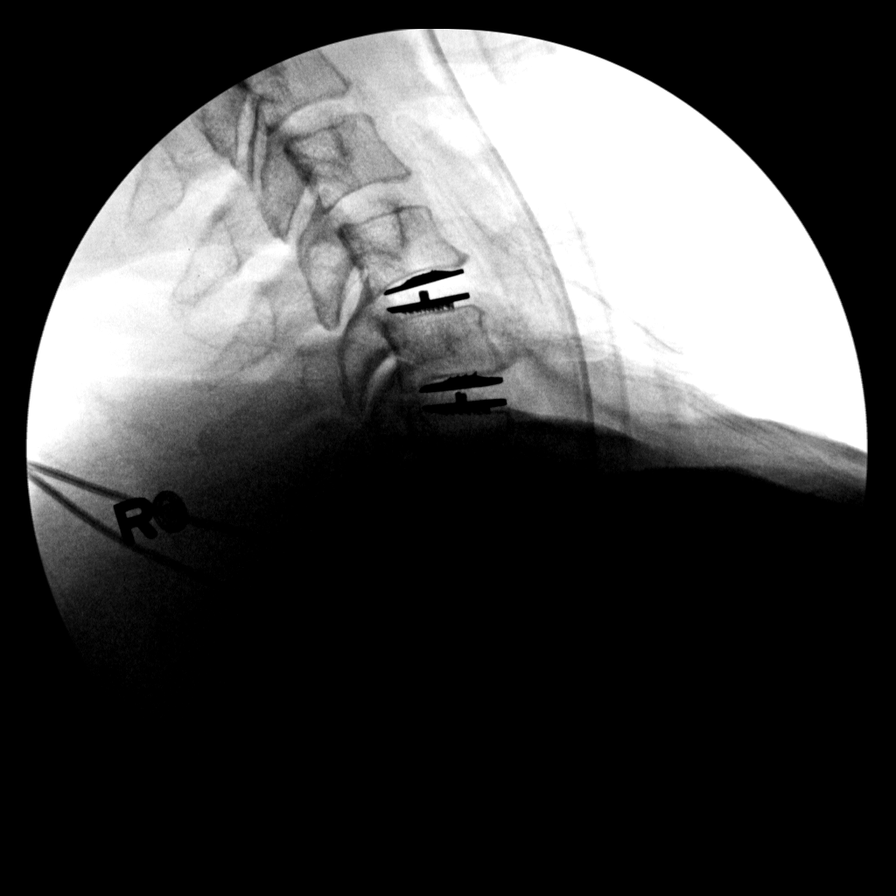
[im 6/6]
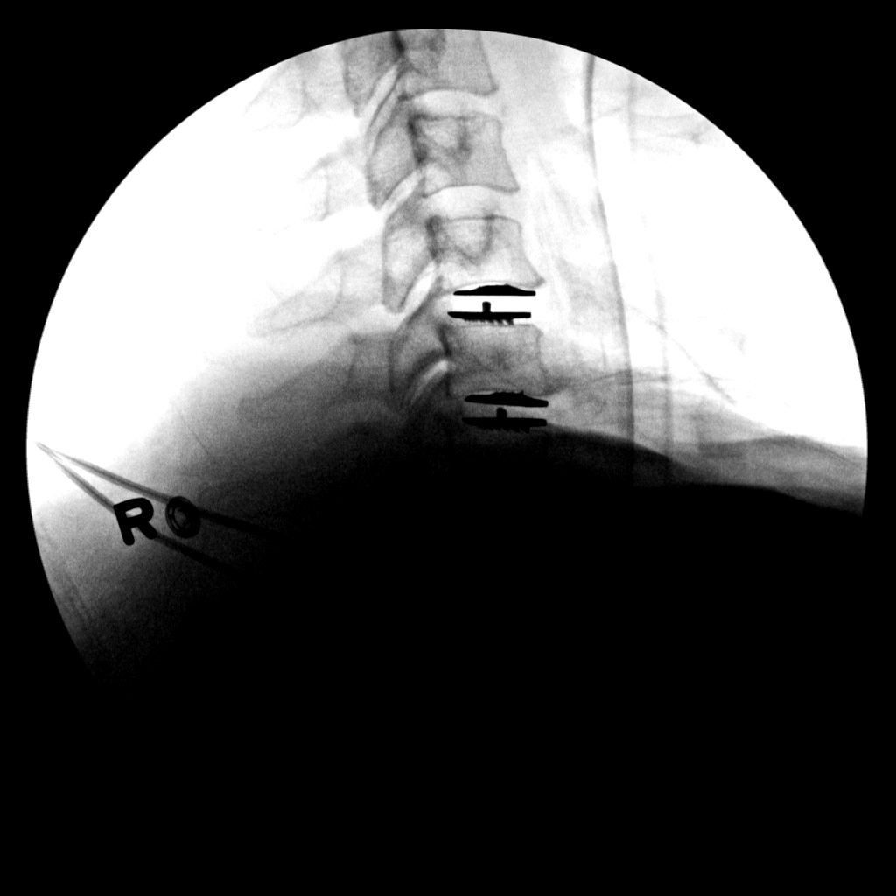

[6 of 6 positions shown; findings below may reference images not displayed]

FINDINGS: Six images from intraoperative C-arm radiography show placement of
interbody disc device at C5-6 and C6-7. Anterior screws are placed
within the C5 and C6 vertebra.
IMPRESSION: 1. Status post disc replacement at C5-6 and C6-7.

## 2016-09-25 ENCOUNTER — Telehealth: Payer: Worker's Compensation | Admitting: Family

## 2016-09-25 DIAGNOSIS — B9689 Other specified bacterial agents as the cause of diseases classified elsewhere: Secondary | ICD-10-CM

## 2016-09-25 DIAGNOSIS — J028 Acute pharyngitis due to other specified organisms: Secondary | ICD-10-CM

## 2016-09-25 MED ORDER — BENZONATATE 100 MG PO CAPS: 100.0000 mg | ORAL_CAPSULE | Freq: Three times a day (TID) | ORAL | 0 refills | Status: AC | PRN

## 2016-09-25 MED ORDER — AZITHROMYCIN 250 MG PO TABS: ORAL_TABLET | ORAL | 0 refills | Status: AC

## 2016-09-25 NOTE — Progress Notes (Signed)

## 2017-01-18 DIAGNOSIS — R42 Dizziness and giddiness: Secondary | ICD-10-CM | POA: Diagnosis not present

## 2017-01-18 DIAGNOSIS — H538 Other visual disturbances: Secondary | ICD-10-CM | POA: Diagnosis not present

## 2017-03-11 DIAGNOSIS — G4733 Obstructive sleep apnea (adult) (pediatric): Secondary | ICD-10-CM | POA: Diagnosis not present

## 2017-03-11 DIAGNOSIS — H6122 Impacted cerumen, left ear: Secondary | ICD-10-CM | POA: Diagnosis not present

## 2017-03-16 DIAGNOSIS — G4733 Obstructive sleep apnea (adult) (pediatric): Secondary | ICD-10-CM | POA: Diagnosis not present

## 2017-12-30 ENCOUNTER — Encounter: Payer: Self-pay | Admitting: *Deleted

## 2018-03-03 ENCOUNTER — Encounter: Payer: Self-pay | Admitting: *Deleted

## 2018-03-04 ENCOUNTER — Encounter
Admission: RE | Disposition: A | Discharge status: Home / Self Care | Payer: Self-pay | Source: Ambulatory Visit | Attending: Gastroenterology

## 2018-03-04 ENCOUNTER — Ambulatory Visit: Payer: No Typology Code available for payment source | Admitting: Anesthesiology

## 2018-03-04 ENCOUNTER — Ambulatory Visit
Admission: RE | Admit: 2018-03-04 | Discharge: 2018-03-04 | Disposition: A | Discharge status: Home / Self Care | Payer: No Typology Code available for payment source | Source: Ambulatory Visit | Attending: Gastroenterology | Admitting: Gastroenterology

## 2018-03-04 DIAGNOSIS — Z79899 Other long term (current) drug therapy: Secondary | ICD-10-CM | POA: Insufficient documentation

## 2018-03-04 DIAGNOSIS — Z800 Family history of malignant neoplasm of digestive organs: Secondary | ICD-10-CM | POA: Diagnosis not present

## 2018-03-04 DIAGNOSIS — K635 Polyp of colon: Secondary | ICD-10-CM | POA: Insufficient documentation

## 2018-03-04 DIAGNOSIS — G4730 Sleep apnea, unspecified: Secondary | ICD-10-CM | POA: Insufficient documentation

## 2018-03-04 DIAGNOSIS — Z1211 Encounter for screening for malignant neoplasm of colon: Secondary | ICD-10-CM | POA: Diagnosis not present

## 2018-03-04 DIAGNOSIS — Z9989 Dependence on other enabling machines and devices: Secondary | ICD-10-CM | POA: Insufficient documentation

## 2018-03-04 DIAGNOSIS — K5730 Diverticulosis of large intestine without perforation or abscess without bleeding: Secondary | ICD-10-CM | POA: Insufficient documentation

## 2018-03-04 HISTORY — PX: COLONOSCOPY WITH PROPOFOL: SHX5780

## 2018-03-04 HISTORY — DX: Chronic rhinitis: J31.0

## 2018-03-04 SURGERY — COLONOSCOPY WITH PROPOFOL
Anesthesia: General

## 2018-03-04 MED ORDER — FENTANYL CITRATE (PF) 100 MCG/2ML IJ SOLN
INTRAMUSCULAR | Status: AC
  Filled 2018-03-04: qty 2

## 2018-03-04 MED ORDER — PROPOFOL 500 MG/50ML IV EMUL
INTRAVENOUS | Status: AC
  Filled 2018-03-04: qty 50

## 2018-03-04 MED ORDER — PROPOFOL 500 MG/50ML IV EMUL
INTRAVENOUS | Status: DC | PRN
  Administered 2018-03-04: 160 ug/kg/min via INTRAVENOUS

## 2018-03-04 MED ORDER — EPHEDRINE SULFATE 50 MG/ML IJ SOLN
INTRAMUSCULAR | Status: DC | PRN
  Administered 2018-03-04: 10 mg via INTRAVENOUS

## 2018-03-04 MED ORDER — EPHEDRINE SULFATE 50 MG/ML IJ SOLN
INTRAMUSCULAR | Status: AC
  Filled 2018-03-04: qty 1

## 2018-03-04 MED ORDER — SODIUM CHLORIDE 0.9 % IV SOLN
INTRAVENOUS | Status: DC | PRN
  Administered 2018-03-04: 14:00:00 via INTRAVENOUS

## 2018-03-04 MED ORDER — LIDOCAINE 2% (20 MG/ML) 5 ML SYRINGE
INTRAMUSCULAR | Status: DC | PRN
  Administered 2018-03-04: 30 mg via INTRAVENOUS

## 2018-03-04 MED ORDER — GLYCOPYRROLATE 0.2 MG/ML IJ SOLN
INTRAMUSCULAR | Status: DC | PRN
  Administered 2018-03-04: 0.2 mg via INTRAVENOUS

## 2018-03-04 MED ORDER — FENTANYL CITRATE (PF) 100 MCG/2ML IJ SOLN
INTRAMUSCULAR | Status: DC | PRN
  Administered 2018-03-04: 100 ug via INTRAVENOUS

## 2018-03-04 MED ORDER — PROPOFOL 10 MG/ML IV BOLUS
INTRAVENOUS | Status: DC | PRN
  Administered 2018-03-04: 30 mg via INTRAVENOUS
  Administered 2018-03-04: 100 mg via INTRAVENOUS

## 2018-03-04 NOTE — Anesthesia Post-op Follow-up Note (Signed)
Anesthesia QCDR form completed.        

## 2018-03-04 NOTE — Transfer of Care (Signed)
Immediate Anesthesia Transfer of Care Note  Patient: Anthony Gallegos  Procedure(s) Performed: COLONOSCOPY WITH PROPOFOL (N/A )  Patient Location: PACU and Endoscopy Unit  Anesthesia Type:General  Level of Consciousness: drowsy  Airway & Oxygen Therapy: Patient Spontanous Breathing and Patient connected to nasal cannula oxygen  Post-op Assessment: Report given to RN and Post -op Vital signs reviewed and stable  Post vital signs: Reviewed and stable  Last Vitals:  Vitals Value Taken Time  BP    Temp    Pulse 63 03/04/2018  2:44 PM  Resp 11 03/04/2018  2:44 PM  SpO2 100 % 03/04/2018  2:44 PM  Vitals shown include unvalidated device data.  Last Pain:  Vitals:   03/04/18 1333  TempSrc: Tympanic      Patients Stated Pain Goal: 0 (72/09/10 6816)  Complications: No apparent anesthesia complications

## 2018-03-04 NOTE — Anesthesia Preprocedure Evaluation (Signed)
Anesthesia Evaluation  Patient identified by MRN, date of birth, ID band  Reviewed: Allergy & Precautions, H&P , NPO status , Patient's Chart, lab work & pertinent test results  History of Anesthesia Complications (+) DIFFICULT AIRWAY  Airway Mallampati: IV  TM Distance: >3 FB Neck ROM: Full  Mouth opening: Limited Mouth Opening  Dental no notable dental hx. (+) Teeth Intact, Dental Advisory Given   Pulmonary sleep apnea and Continuous Positive Airway Pressure Ventilation ,    Pulmonary exam normal breath sounds clear to auscultation       Cardiovascular negative cardio ROS   Rhythm:Regular Rate:Normal     Neuro/Psych negative neurological ROS  negative psych ROS   GI/Hepatic negative GI ROS, Neg liver ROS,   Endo/Other  negative endocrine ROS  Renal/GU negative Renal ROS  negative genitourinary   Musculoskeletal   Abdominal Normal abdominal exam  (+)   Peds negative pediatric ROS (+)  Hematology negative hematology ROS (+)   Anesthesia Other Findings   Reproductive/Obstetrics negative OB ROS                             Anesthesia Physical  Anesthesia Plan  ASA: III  Anesthesia Plan: General   Post-op Pain Management:    Induction: Intravenous  PONV Risk Score and Plan: Propofol infusion  Airway Management Planned: Nasal Cannula  Additional Equipment:   Intra-op Plan:   Post-operative Plan:   Informed Consent: I have reviewed the patients History and Physical, chart, labs and discussed the procedure including the risks, benefits and alternatives for the proposed anesthesia with the patient or authorized representative who has indicated his/her understanding and acceptance.   Dental advisory given  Plan Discussed with: CRNA  Anesthesia Plan Comments:         Anesthesia Quick Evaluation

## 2018-03-04 NOTE — Op Note (Signed)
[facility]enter[facility] Patient Name: Anthony Gallegos Procedure Date: 03/04/2018 1:52 PM MRN: [id number] Account #: [id number] Date of Birth: 04-Sep-1970 Admit Type: Outpatient Age: 47 Room: [facility] ENDO ROOM 1 Gender: Male Note Status: Finalized Procedure:            Colonoscopy Indications:          Family history of colon cancer in a first-degree                        relative Providers:            Lollie Sails, MD Referring MD:         Ocie Cornfield. Ouida Sills MD, MD (Referring MD) Medicines:            Monitored Anesthesia Care Complications:        No immediate complications. Procedure:            Pre-Anesthesia Assessment:                       - ASA Grade Assessment: III - A patient with severe                        systemic disease.                       After obtaining informed consent, the colonoscope was                        passed under direct vision. Throughout the procedure,                        the patient's blood pressure, pulse, and oxygen                        saturations were monitored continuously. The                        Colonoscope was introduced through the anus and                        advanced to the the cecum, identified by appendiceal                        orifice and ileocecal valve. The colonoscopy was                        performed with moderate difficulty due to poor bowel                        prep. Successful completion of the procedure was aided                        by lavage. The patient tolerated the procedure well.                        The quality of the bowel preparation was good except                        the ascending colon was poor. Findings:      A few small-mouthed diverticula were found in the sigmoid colon  and       descending colon.      A 2 mm polyp was found in the descending colon. The polyp was sessile.       The polyp was removed with a cold biopsy forceps. Resection and       retrieval  were complete.      The digital rectal exam was normal. Impression:           - Diverticulosis in the sigmoid colon and in the                        descending colon.                       - One 2 mm polyp in the descending colon, removed with                        a cold biopsy forceps. Resected and retrieved. Recommendation:       - Discharge patient to home.                       - Await pathology results.                       - Telephone GI clinic for pathology results in 1 week. Lollie Sails, MD 03/04/2018 2:39:41 PM This report has been signed electronically. Number of Addenda: 0 Note Initiated On: 03/04/2018 1:52 PM Scope Withdrawal Time: 0 hours 7 minutes 53 seconds  Total Procedure Duration: 0 hours 35 minutes 0 seconds       [facility]

## 2018-03-04 NOTE — Anesthesia Postprocedure Evaluation (Signed)
Anesthesia Post Note  Patient: Anthony Gallegos  Procedure(s) Performed: COLONOSCOPY WITH PROPOFOL (N/A )  Patient location during evaluation: PACU Anesthesia Type: General Level of consciousness: awake and alert and oriented Pain management: pain level controlled Vital Signs Assessment: post-procedure vital signs reviewed and stable Respiratory status: spontaneous breathing Cardiovascular status: blood pressure returned to baseline Anesthetic complications: no     Last Vitals:  Vitals:   03/04/18 1444 03/04/18 1445  BP: (!) 90/37 (!) 90/37  Pulse:  63  Resp:  17  Temp: (!) 36.1 C (!) 36.1 C  SpO2:  100%    Last Pain:  Vitals:   03/04/18 1444  TempSrc: Tympanic                 [name]

## 2018-03-04 NOTE — H&P (Signed)
Outpatient short stay form Pre-procedure 03/04/2018 1:44 PM Lollie Sails MD  Primary Physician: Frazier Richards, MD  Reason for visit: Colonoscopy  History of present illness: Patient is a 47 year old male presenting today as above.  There is a family history of colon cancer in a primary relative, father.  Has had a colonoscopy in the past in 2013 that showing only diverticulosis.  He is presenting today for repeat.  He tolerated his prep well.  He takes no aspirin or blood thinning agent.   No current facility-administered medications for this encounter.   Medications Prior to Admission  Medication Sig Dispense Refill Last Dose  . fluticasone (FLONASE) 50 MCG/ACT nasal spray Place 2 sprays into both nostrils daily.   Past Week at Unknown time  . fluvoxaMINE (LUVOX) 50 MG tablet Take 50 mg by mouth at bedtime.   Past Month at Unknown time  . methocarbamol (ROBAXIN) 500 MG tablet Take 1 tablet (500 mg total) by mouth 3 (three) times daily as needed for muscle spasms. 60 tablet 0 Past Week at Unknown time  . omeprazole (PRILOSEC) 20 MG capsule Take 20 mg by mouth daily.   Past Week at Unknown time  . ondansetron (ZOFRAN) 4 MG tablet Take 1 tablet (4 mg total) by mouth every 8 (eight) hours as needed for nausea or vomiting. 20 tablet 0 Past Month at Unknown time  . oxyCODONE-acetaminophen (PERCOCET) 10-325 MG tablet Take 1 tablet by mouth every 4 (four) hours as needed for pain. 60 tablet 0 Past Month at Unknown time  . azithromycin (ZITHROMAX Z-PAK) 250 MG tablet As directed 6 tablet 0   . azithromycin (ZITHROMAX) 250 MG tablet Take 2 tabs now then 1 daily times 4 days 6 tablet 0   . benzonatate (TESSALON PERLES) 100 MG capsule Take 1 capsule (100 mg total) by mouth 3 (three) times daily as needed for cough. 20 capsule 0   . benzonatate (TESSALON PERLES) 100 MG capsule Take 1-2 capsules (100-200 mg total) by mouth every 8 (eight) hours as needed for cough. 30 capsule 0      No Known  Allergies   Past Medical History:  Diagnosis Date  . Cervical disc herniation    with nerve compression 5-7  . Difficult intubation    He doesn't recall being told he was a difficult intubation. 09/02/12 [place] records indicate Grade IV view, successful after 3 attempts using 3MAC  . Rhinitis   . Seasonal allergies   . Sleep apnea    wears CPAP    Review of systems:      Physical Exam    Heart and lungs: Rate and rhythm without rub or gallop, lungs are bilaterally clear.    HEENT: Normocephalic atraumatic eyes are anicteric    Other:    Pertinant exam for procedure: Soft nontender nondistended bowel sounds positive normoactive.    Planned proceedures: Colonoscopy and indicated procedures. I have discussed the risks benefits and complications of procedures to include not limited to bleeding, infection, perforation and the risk of sedation and the patient wishes to proceed.    Lollie Sails, MD Gastroenterology 03/04/2018  1:44 PM

## 2018-03-07 ENCOUNTER — Encounter: Payer: Self-pay | Admitting: Gastroenterology

## 2018-03-08 LAB — SURGICAL PATHOLOGY

## 2019-07-07 DIAGNOSIS — F411 Generalized anxiety disorder: Secondary | ICD-10-CM | POA: Diagnosis not present

## 2019-07-07 DIAGNOSIS — F429 Obsessive-compulsive disorder, unspecified: Secondary | ICD-10-CM | POA: Diagnosis not present

## 2019-07-07 DIAGNOSIS — F331 Major depressive disorder, recurrent, moderate: Secondary | ICD-10-CM | POA: Diagnosis not present

## 2019-08-21 DIAGNOSIS — F411 Generalized anxiety disorder: Secondary | ICD-10-CM | POA: Diagnosis not present

## 2019-08-21 DIAGNOSIS — F429 Obsessive-compulsive disorder, unspecified: Secondary | ICD-10-CM | POA: Diagnosis not present

## 2019-08-21 DIAGNOSIS — F331 Major depressive disorder, recurrent, moderate: Secondary | ICD-10-CM | POA: Diagnosis not present

## 2019-10-24 DIAGNOSIS — F331 Major depressive disorder, recurrent, moderate: Secondary | ICD-10-CM | POA: Diagnosis not present

## 2019-10-24 DIAGNOSIS — F429 Obsessive-compulsive disorder, unspecified: Secondary | ICD-10-CM | POA: Diagnosis not present

## 2019-10-24 DIAGNOSIS — F411 Generalized anxiety disorder: Secondary | ICD-10-CM | POA: Diagnosis not present

## 2019-11-27 DIAGNOSIS — F429 Obsessive-compulsive disorder, unspecified: Secondary | ICD-10-CM | POA: Diagnosis not present

## 2019-11-27 DIAGNOSIS — F411 Generalized anxiety disorder: Secondary | ICD-10-CM | POA: Diagnosis not present

## 2019-11-27 DIAGNOSIS — F331 Major depressive disorder, recurrent, moderate: Secondary | ICD-10-CM | POA: Diagnosis not present

## 2020-02-22 DIAGNOSIS — F429 Obsessive-compulsive disorder, unspecified: Secondary | ICD-10-CM | POA: Diagnosis not present

## 2020-02-22 DIAGNOSIS — F331 Major depressive disorder, recurrent, moderate: Secondary | ICD-10-CM | POA: Diagnosis not present

## 2020-02-22 DIAGNOSIS — F411 Generalized anxiety disorder: Secondary | ICD-10-CM | POA: Diagnosis not present

## 2020-03-20 DIAGNOSIS — Z0000 Encounter for general adult medical examination without abnormal findings: Secondary | ICD-10-CM | POA: Diagnosis not present

## 2020-03-20 DIAGNOSIS — F429 Obsessive-compulsive disorder, unspecified: Secondary | ICD-10-CM | POA: Diagnosis not present

## 2020-03-20 DIAGNOSIS — G4733 Obstructive sleep apnea (adult) (pediatric): Secondary | ICD-10-CM | POA: Diagnosis not present

## 2020-03-20 DIAGNOSIS — Z23 Encounter for immunization: Secondary | ICD-10-CM | POA: Diagnosis not present

## 2020-03-25 ENCOUNTER — Other Ambulatory Visit: Payer: Self-pay | Admitting: Unknown Physician Specialty

## 2020-03-25 DIAGNOSIS — Z0000 Encounter for general adult medical examination without abnormal findings: Secondary | ICD-10-CM | POA: Diagnosis not present

## 2020-03-25 DIAGNOSIS — H6123 Impacted cerumen, bilateral: Secondary | ICD-10-CM | POA: Diagnosis not present

## 2020-03-25 DIAGNOSIS — G518 Other disorders of facial nerve: Secondary | ICD-10-CM | POA: Diagnosis not present

## 2020-03-25 DIAGNOSIS — G4733 Obstructive sleep apnea (adult) (pediatric): Secondary | ICD-10-CM | POA: Diagnosis not present

## 2020-04-01 DIAGNOSIS — G4733 Obstructive sleep apnea (adult) (pediatric): Secondary | ICD-10-CM | POA: Diagnosis not present

## 2020-04-01 DIAGNOSIS — G518 Other disorders of facial nerve: Secondary | ICD-10-CM | POA: Diagnosis not present

## 2020-06-20 ENCOUNTER — Other Ambulatory Visit: Payer: Self-pay | Admitting: Psychiatry

## 2020-06-20 DIAGNOSIS — F331 Major depressive disorder, recurrent, moderate: Secondary | ICD-10-CM | POA: Diagnosis not present

## 2020-06-20 DIAGNOSIS — F411 Generalized anxiety disorder: Secondary | ICD-10-CM | POA: Diagnosis not present

## 2020-06-20 DIAGNOSIS — F429 Obsessive-compulsive disorder, unspecified: Secondary | ICD-10-CM | POA: Diagnosis not present

## 2020-09-16 ENCOUNTER — Other Ambulatory Visit: Payer: Self-pay | Admitting: Psychiatry

## 2020-09-16 DIAGNOSIS — F331 Major depressive disorder, recurrent, moderate: Secondary | ICD-10-CM | POA: Diagnosis not present

## 2020-09-16 DIAGNOSIS — F429 Obsessive-compulsive disorder, unspecified: Secondary | ICD-10-CM | POA: Diagnosis not present

## 2020-09-16 DIAGNOSIS — F411 Generalized anxiety disorder: Secondary | ICD-10-CM | POA: Diagnosis not present

## 2020-09-27 ENCOUNTER — Other Ambulatory Visit: Payer: Self-pay

## 2020-11-07 DIAGNOSIS — H02051 Trichiasis without entropian right upper eyelid: Secondary | ICD-10-CM | POA: Diagnosis not present

## 2021-01-21 ENCOUNTER — Other Ambulatory Visit: Payer: Self-pay

## 2021-01-21 DIAGNOSIS — F331 Major depressive disorder, recurrent, moderate: Secondary | ICD-10-CM | POA: Diagnosis not present

## 2021-01-21 DIAGNOSIS — F411 Generalized anxiety disorder: Secondary | ICD-10-CM | POA: Diagnosis not present

## 2021-01-21 DIAGNOSIS — F429 Obsessive-compulsive disorder, unspecified: Secondary | ICD-10-CM | POA: Diagnosis not present

## 2021-01-21 MED ORDER — BUPROPION HCL ER (SR) 150 MG PO TB12
ORAL_TABLET | ORAL | 0 refills | Status: AC
  Filled 2021-01-21: qty 180, 90d supply, fill #0

## 2021-01-21 MED ORDER — SERTRALINE HCL 100 MG PO TABS
ORAL_TABLET | ORAL | 0 refills | Status: AC
  Filled 2021-01-21: qty 180, 90d supply, fill #0

## 2021-02-03 ENCOUNTER — Other Ambulatory Visit: Payer: Self-pay

## 2021-04-21 ENCOUNTER — Other Ambulatory Visit: Payer: Self-pay

## 2021-04-21 DIAGNOSIS — F411 Generalized anxiety disorder: Secondary | ICD-10-CM | POA: Diagnosis not present

## 2021-04-21 DIAGNOSIS — F429 Obsessive-compulsive disorder, unspecified: Secondary | ICD-10-CM | POA: Diagnosis not present

## 2021-04-21 DIAGNOSIS — F331 Major depressive disorder, recurrent, moderate: Secondary | ICD-10-CM | POA: Diagnosis not present

## 2021-04-21 MED ORDER — SERTRALINE HCL 100 MG PO TABS
ORAL_TABLET | ORAL | 0 refills | Status: AC
  Filled 2021-04-21: qty 180, 90d supply, fill #0

## 2021-04-21 MED ORDER — BUPROPION HCL ER (SR) 150 MG PO TB12
ORAL_TABLET | ORAL | 0 refills | Status: AC
  Filled 2021-04-21 (×2): qty 180, 90d supply, fill #0

## 2021-04-22 ENCOUNTER — Other Ambulatory Visit: Payer: Self-pay

## 2021-05-08 ENCOUNTER — Other Ambulatory Visit: Payer: Self-pay

## 2021-09-15 ENCOUNTER — Other Ambulatory Visit: Payer: Self-pay

## 2021-09-15 DIAGNOSIS — F331 Major depressive disorder, recurrent, moderate: Secondary | ICD-10-CM | POA: Diagnosis not present

## 2021-09-15 DIAGNOSIS — F411 Generalized anxiety disorder: Secondary | ICD-10-CM | POA: Diagnosis not present

## 2021-09-15 DIAGNOSIS — F429 Obsessive-compulsive disorder, unspecified: Secondary | ICD-10-CM | POA: Diagnosis not present

## 2021-09-15 MED ORDER — SERTRALINE HCL 100 MG PO TABS
ORAL_TABLET | ORAL | 0 refills | Status: AC
  Filled 2021-09-15: qty 180, 90d supply, fill #0

## 2021-09-15 MED ORDER — BUPROPION HCL ER (SR) 150 MG PO TB12
ORAL_TABLET | ORAL | 0 refills | Status: AC
  Filled 2021-09-15: qty 180, 90d supply, fill #0

## 2021-09-30 ENCOUNTER — Other Ambulatory Visit: Payer: Self-pay

## 2021-12-08 ENCOUNTER — Other Ambulatory Visit: Payer: Self-pay

## 2021-12-08 DIAGNOSIS — F411 Generalized anxiety disorder: Secondary | ICD-10-CM | POA: Diagnosis not present

## 2021-12-08 DIAGNOSIS — F429 Obsessive-compulsive disorder, unspecified: Secondary | ICD-10-CM | POA: Diagnosis not present

## 2021-12-08 DIAGNOSIS — F331 Major depressive disorder, recurrent, moderate: Secondary | ICD-10-CM | POA: Diagnosis not present

## 2021-12-08 MED ORDER — SERTRALINE HCL 100 MG PO TABS
ORAL_TABLET | ORAL | 0 refills | Status: AC
  Filled 2021-12-08: qty 180, 90d supply, fill #0

## 2021-12-08 MED ORDER — BUPROPION HCL ER (SR) 150 MG PO TB12
ORAL_TABLET | ORAL | 0 refills | Status: AC
  Filled 2021-12-08: qty 180, 90d supply, fill #0

## 2021-12-18 ENCOUNTER — Other Ambulatory Visit: Payer: Self-pay

## 2021-12-18 DIAGNOSIS — G4733 Obstructive sleep apnea (adult) (pediatric): Secondary | ICD-10-CM | POA: Diagnosis not present

## 2021-12-18 DIAGNOSIS — Z0000 Encounter for general adult medical examination without abnormal findings: Secondary | ICD-10-CM | POA: Diagnosis not present

## 2021-12-18 DIAGNOSIS — F429 Obsessive-compulsive disorder, unspecified: Secondary | ICD-10-CM | POA: Diagnosis not present

## 2021-12-18 DIAGNOSIS — L989 Disorder of the skin and subcutaneous tissue, unspecified: Secondary | ICD-10-CM | POA: Diagnosis not present

## 2021-12-18 DIAGNOSIS — F411 Generalized anxiety disorder: Secondary | ICD-10-CM | POA: Diagnosis not present

## 2021-12-18 DIAGNOSIS — Z9989 Dependence on other enabling machines and devices: Secondary | ICD-10-CM | POA: Diagnosis not present

## 2021-12-18 DIAGNOSIS — Z131 Encounter for screening for diabetes mellitus: Secondary | ICD-10-CM | POA: Diagnosis not present

## 2021-12-18 DIAGNOSIS — Z125 Encounter for screening for malignant neoplasm of prostate: Secondary | ICD-10-CM | POA: Diagnosis not present

## 2021-12-18 MED ORDER — LOSARTAN POTASSIUM-HCTZ 100-12.5 MG PO TABS
1.0000 | ORAL_TABLET | Freq: Every day | ORAL | 3 refills | Status: AC
  Filled 2021-12-18: qty 90, 90d supply, fill #0

## 2022-01-13 DIAGNOSIS — Z9989 Dependence on other enabling machines and devices: Secondary | ICD-10-CM | POA: Diagnosis not present

## 2022-01-13 DIAGNOSIS — G4733 Obstructive sleep apnea (adult) (pediatric): Secondary | ICD-10-CM | POA: Diagnosis not present

## 2022-04-15 ENCOUNTER — Other Ambulatory Visit: Payer: Self-pay

## 2022-04-15 DIAGNOSIS — F429 Obsessive-compulsive disorder, unspecified: Secondary | ICD-10-CM | POA: Diagnosis not present

## 2022-04-15 DIAGNOSIS — F411 Generalized anxiety disorder: Secondary | ICD-10-CM | POA: Diagnosis not present

## 2022-04-15 DIAGNOSIS — F331 Major depressive disorder, recurrent, moderate: Secondary | ICD-10-CM | POA: Diagnosis not present

## 2022-04-15 MED ORDER — BUPROPION HCL ER (SR) 150 MG PO TB12
ORAL_TABLET | ORAL | 0 refills | Status: AC
  Filled 2022-04-15: qty 180, 90d supply, fill #0

## 2022-04-15 MED ORDER — SERTRALINE HCL 100 MG PO TABS
ORAL_TABLET | ORAL | 0 refills | Status: AC
  Filled 2022-04-15: qty 180, 90d supply, fill #0

## 2022-05-01 ENCOUNTER — Other Ambulatory Visit: Payer: Self-pay

## 2022-07-01 ENCOUNTER — Ambulatory Visit: Payer: No Typology Code available for payment source | Admitting: Dermatology

## 2022-07-03 ENCOUNTER — Other Ambulatory Visit: Payer: Self-pay

## 2022-07-03 DIAGNOSIS — F411 Generalized anxiety disorder: Secondary | ICD-10-CM | POA: Diagnosis not present

## 2022-07-03 DIAGNOSIS — Z23 Encounter for immunization: Secondary | ICD-10-CM | POA: Diagnosis not present

## 2022-07-03 DIAGNOSIS — G4733 Obstructive sleep apnea (adult) (pediatric): Secondary | ICD-10-CM | POA: Diagnosis not present

## 2022-07-03 DIAGNOSIS — F429 Obsessive-compulsive disorder, unspecified: Secondary | ICD-10-CM | POA: Diagnosis not present

## 2022-07-03 DIAGNOSIS — I10 Essential (primary) hypertension: Secondary | ICD-10-CM | POA: Diagnosis not present

## 2022-07-03 MED ORDER — ACYCLOVIR 800 MG PO TABS
800.0000 mg | ORAL_TABLET | Freq: Four times a day (QID) | ORAL | 2 refills | Status: AC
  Filled 2022-07-03: qty 30, 8d supply, fill #0

## 2022-07-06 ENCOUNTER — Other Ambulatory Visit: Payer: Self-pay

## 2022-07-09 ENCOUNTER — Other Ambulatory Visit: Payer: Self-pay

## 2022-07-09 DIAGNOSIS — F429 Obsessive-compulsive disorder, unspecified: Secondary | ICD-10-CM | POA: Diagnosis not present

## 2022-07-09 DIAGNOSIS — F3340 Major depressive disorder, recurrent, in remission, unspecified: Secondary | ICD-10-CM | POA: Diagnosis not present

## 2022-07-09 DIAGNOSIS — F411 Generalized anxiety disorder: Secondary | ICD-10-CM | POA: Diagnosis not present

## 2022-07-09 MED ORDER — SERTRALINE HCL 100 MG PO TABS
200.0000 mg | ORAL_TABLET | Freq: Every morning | ORAL | 0 refills | Status: AC
  Filled 2022-07-09: qty 180, 90d supply, fill #0

## 2022-07-09 MED ORDER — BUPROPION HCL ER (SR) 150 MG PO TB12
150.0000 mg | ORAL_TABLET | Freq: Two times a day (BID) | ORAL | 0 refills | Status: AC
  Filled 2022-07-09: qty 180, 90d supply, fill #0

## 2022-07-16 ENCOUNTER — Ambulatory Visit (INDEPENDENT_AMBULATORY_CARE_PROVIDER_SITE_OTHER): Payer: 59 | Admitting: Dermatology

## 2022-07-16 VITALS — BP 175/106 | HR 75

## 2022-07-16 DIAGNOSIS — L989 Disorder of the skin and subcutaneous tissue, unspecified: Secondary | ICD-10-CM

## 2022-07-16 DIAGNOSIS — B079 Viral wart, unspecified: Secondary | ICD-10-CM | POA: Diagnosis not present

## 2022-07-16 DIAGNOSIS — D492 Neoplasm of unspecified behavior of bone, soft tissue, and skin: Secondary | ICD-10-CM

## 2022-07-16 NOTE — Patient Instructions (Addendum)
Wound Care Instructions  Cleanse wound gently with soap and water once a day then pat dry with clean gauze. Apply a thin coat of Petrolatum (petroleum jelly, "Vaseline") over the wound (unless you have an allergy to this). We recommend that you use a new, sterile tube of Vaseline. Do not pick or remove scabs. Do not remove the yellow or white "healing tissue" from the base of the wound.  Cover the wound with fresh, clean, nonstick gauze and secure with paper tape. You may use Band-Aids in place of gauze and tape if the wound is small enough, but would recommend trimming much of the tape off as there is often too much. Sometimes Band-Aids can irritate the skin.  You should call the office for your biopsy report after 1 week if you have not already been contacted.  If you experience any problems, such as abnormal amounts of bleeding, swelling, significant bruising, significant pain, or evidence of infection, please call the office immediately.  FOR ADULT SURGERY PATIENTS: If you need something for pain relief you may take 1 extra strength Tylenol (acetaminophen) AND 2 Ibuprofen (200mg each) together every 4 hours as needed for pain. (do not take these if you are allergic to them or if you have a reason you should not take them.) Typically, you may only need pain medication for 1 to 3 days.     Due to recent changes in healthcare laws, you may see results of your pathology and/or laboratory studies on MyChart before the doctors have had a chance to review them. We understand that in some cases there may be results that are confusing or concerning to you. Please understand that not all results are received at the same time and often the doctors may need to interpret multiple results in order to provide you with the best plan of care or course of treatment. Therefore, we ask that you please give us 2 business days to thoroughly review all your results before contacting the office for clarification. Should  we see a critical lab result, you will be contacted sooner.   If You Need Anything After Your Visit  If you have any questions or concerns for your doctor, please call our main line at [phone] and press option 4 to reach your doctor's medical assistant. If no one answers, please leave a voicemail as directed and we will return your call as soon as possible. Messages left after 4 pm will be answered the following business day.   You may also send us a message via MyChart. We typically respond to MyChart messages within 1-2 business days.  For prescription refills, please ask your pharmacy to contact our office. Our fax number is [phone].  If you have an urgent issue when the clinic is closed that cannot wait until the next business day, you can page your doctor at the number below.    Please note that while we do our best to be available for urgent issues outside of office hours, we are not available 24/7.   If you have an urgent issue and are unable to reach us, you may choose to seek medical care at your doctor's office, retail clinic, urgent care center, or emergency room.  If you have a medical emergency, please immediately call 911 or go to the emergency department.  Pager Numbers  - Dr. Kowalski: [phone]  - Dr. Moye: [phone]  - Dr. Stewart: [phone]  In the event of inclement weather, please call our main line at   [phone] for an update on the status of any delays or closures.  Dermatology Medication Tips: Please keep the boxes that topical medications come in in order to help keep track of the instructions about where and how to use these. Pharmacies typically print the medication instructions only on the boxes and not directly on the medication tubes.   If your medication is too expensive, please contact our office at [phone] option 4 or send us a message through MyChart.   We are unable to tell what your co-pay for medications will be in  advance as this is different depending on your insurance coverage. However, we may be able to find a substitute medication at lower cost or fill out paperwork to get insurance to cover a needed medication.   If a prior authorization is required to get your medication covered by your insurance company, please allow us 1-2 business days to complete this process.  Drug prices often vary depending on where the prescription is filled and some pharmacies may offer cheaper prices.  The website www.goodrx.com contains coupons for medications through different pharmacies. The prices here do not account for what the cost may be with help from insurance (it may be cheaper with your insurance), but the website can give you the price if you did not use any insurance.  - You can print the associated coupon and take it with your prescription to the pharmacy.  - You may also stop by our office during regular business hours and pick up a GoodRx coupon card.  - If you need your prescription sent electronically to a different pharmacy, notify our office through [place] MyChart or by phone at [phone] option 4.     Si Usted Necesita Algo Despus de Su Visita  Tambin puede enviarnos un mensaje a travs de MyChart. Por lo general respondemos a los mensajes de MyChart en el transcurso de 1 a 2 das hbiles.  Para renovar recetas, por favor pida a su farmacia que se ponga en contacto con nuestra oficina. Nuestro nmero de fax es el [phone].  Si tiene un asunto urgente cuando la clnica est cerrada y que no puede esperar hasta el siguiente da hbil, puede llamar/localizar a su doctor(a) al nmero que aparece a continuacin.   Por favor, tenga en cuenta que aunque hacemos todo lo posible para estar disponibles para asuntos urgentes fuera del horario de oficina, no estamos disponibles las 24 horas del da, los 7 das de la semana.   Si tiene un problema urgente y no puede comunicarse con nosotros, puede  optar por buscar atencin mdica  en el consultorio de su doctor(a), en una clnica privada, en un centro de atencin urgente o en una sala de emergencias.  Si tiene una emergencia mdica, por favor llame inmediatamente al 911 o vaya a la sala de emergencias.  Nmeros de bper  - Dr. Kowalski: [phone]  - Dra. Moye: [phone]  - Dra. Stewart: [phone]  En caso de inclemencias del tiempo, por favor llame a nuestra lnea principal al [phone] para una actualizacin sobre el estado de cualquier retraso o cierre.  Consejos para la medicacin en dermatologa: Por favor, guarde las cajas en las que vienen los medicamentos de uso tpico para ayudarle a seguir las instrucciones sobre dnde y cmo usarlos. Las farmacias generalmente imprimen las instrucciones del medicamento slo en las cajas y no directamente en los tubos del medicamento.   Si su medicamento es muy caro, por favor, pngase en contacto con   nuestra oficina llamando al [phone] y presione la opcin 4 o envenos un mensaje a travs de MyChart.   No podemos decirle cul ser su copago por los medicamentos por adelantado ya que esto es diferente dependiendo de la cobertura de su seguro. Sin embargo, es posible que podamos encontrar un medicamento sustituto a menor costo o llenar un formulario para que el seguro cubra el medicamento que se considera necesario.   Si se requiere una autorizacin previa para que su compaa de seguros cubra su medicamento, por favor permtanos de 1 a 2 das hbiles para completar este proceso.  Los precios de los medicamentos varan con frecuencia dependiendo del lugar de dnde se surte la receta y alguna farmacias pueden ofrecer precios ms baratos.  El sitio web www.goodrx.com tiene cupones para medicamentos de diferentes farmacias. Los precios aqu no tienen en cuenta lo que podra costar con la ayuda del seguro (puede ser ms barato con su seguro), pero el sitio web puede darle el  precio si no utiliz ningn seguro.  - Puede imprimir el cupn correspondiente y llevarlo con su receta a la farmacia.  - Tambin puede pasar por nuestra oficina durante el horario de atencin regular y recoger una tarjeta de cupones de GoodRx.  - Si necesita que su receta se enve electrnicamente a una farmacia diferente, informe a nuestra oficina a travs de MyChart de [place] o por telfono llamando al [phone] y presione la opcin 4.  

## 2022-07-16 NOTE — Progress Notes (Signed)
   New Patient Visit  Subjective  Anthony Gallegos is a 52 y.o. male who presents for the following: Follow-up (The patient has spots on the right hand to be evaluated,  may be new or changing and the patient has concerns that these could be cancer. ).  The following portions of the chart were reviewed this encounter and updated as appropriate:   Tobacco  Allergies  Meds  Problems  Med Hx  Surg Hx  Fam Hx     Review of Systems:  No other skin or systemic complaints except as noted in HPI or Assessment and Plan.  Objective  Well appearing patient in no apparent distress; mood and affect are within normal limits.  A focused examination was performed including right hand. Relevant physical exam findings are noted in the Assessment and Plan.  right dorsum hand 1.2 cm hyperkeratotic papule         Assessment & Plan  Neoplasm of skin -changing skin lesion right dorsum hand Epidermal / dermal shaving  Lesion diameter (cm):  1.2 Informed consent: discussed and consent obtained   Timeout: patient name, date of birth, surgical site, and procedure verified   Procedure prep:  Patient was prepped and draped in usual sterile fashion Prep type:  Isopropyl alcohol Anesthesia: the lesion was anesthetized in a standard fashion   Anesthetic:  1% lidocaine w/ epinephrine 1-100,000 buffered w/ 8.4% NaHCO3 Hemostasis achieved with: pressure, aluminum chloride and electrodesiccation   Outcome: patient tolerated procedure well   Post-procedure details: sterile dressing applied and wound care instructions given   Dressing type: bandage and petrolatum    Destruction of lesion  Destruction method: electrodesiccation and curettage   Informed consent: discussed and consent obtained   Timeout:  patient name, date of birth, surgical site, and procedure verified Curettage performed in three different directions: Yes   Electrodesiccation performed over the curetted area: Yes   Lesion length  (cm):  1.2 Lesion width (cm):  1.2 Margin per side (cm):  0.2 Final wound size (cm):  1.6 Hemostasis achieved with:  pressure, aluminum chloride and electrodesiccation Outcome: patient tolerated procedure well with no complications   Post-procedure details: wound care instructions given    Specimen 1 - Surgical pathology Differential Diagnosis: R/O SCC Check Margins: No  Return in about 3 months (around 10/15/2022) for recheck right hand .  IMarye Round, CMA, am acting as scribe for Sarina Ser, MD .  Documentation: I have reviewed the above documentation for accuracy and completeness, and I agree with the above.  Sarina Ser, MD

## 2022-07-17 ENCOUNTER — Other Ambulatory Visit: Payer: Self-pay

## 2022-07-23 ENCOUNTER — Telehealth: Payer: Self-pay

## 2022-07-23 NOTE — Telephone Encounter (Signed)
-----  Message from Ralene Bathe, MD sent at 07/22/2022  8:30 PM EST ----- Diagnosis Skin , right dorsum hand VERRUCA VULGARIS, IRRITATED  Benign irritated viral wart May recur No further treatment needed unless recurs

## 2022-07-23 NOTE — Telephone Encounter (Signed)
Advised pt of bx result/sh ?

## 2022-07-24 ENCOUNTER — Encounter: Payer: Self-pay | Admitting: Dermatology

## 2022-09-28 ENCOUNTER — Encounter: Payer: Self-pay | Admitting: Dermatology

## 2022-09-28 ENCOUNTER — Ambulatory Visit (INDEPENDENT_AMBULATORY_CARE_PROVIDER_SITE_OTHER): Payer: 59 | Admitting: Dermatology

## 2022-09-28 VITALS — BP 140/85 | HR 75

## 2022-09-28 DIAGNOSIS — B078 Other viral warts: Secondary | ICD-10-CM | POA: Diagnosis not present

## 2022-09-28 NOTE — Progress Notes (Signed)
   Follow-Up Visit   Subjective  Anthony Gallegos is a 52 y.o. male who presents for the following: patient here for 3 month recheck on bx proven wart at right hand. Patient hasn't noticed wart coming back at area. Noticed a spot at right elbow he would like checked.   The following portions of the chart were reviewed this encounter and updated as appropriate: medications, allergies, medical history  Review of Systems:  No other skin or systemic complaints except as noted in HPI or Assessment and Plan.  Objective  Well appearing patient in no apparent distress; mood and affect are within normal limits.  Areas Examined: Right hand, right arm  Relevant physical exam findings are noted in the Assessment and Plan.   Assessment & Plan    WART Exam: 0.4 cm  verrucous papule  Wart at right hand is clear at exam  Discussed viral / HPV (Human Papilloma Virus) etiology and risk of spread /infectivity to other areas of body as well as to other people.  Multiple treatments and methods may be required to clear warts and it is possible treatment may not be successful.  Treatment risks include discoloration; scarring and there is still potential for wart recurrence.  Treatment Plan: Destruction Procedure Note Destruction method: cryotherapy   Informed consent: discussed and consent obtained   Lesion destroyed using liquid nitrogen: Yes   Outcome: patient tolerated procedure well with no complications   Post-procedure details: wound care instructions given   Locations: right elbow x 1 # of Lesions Treated: 1  Prior to procedure, discussed risks of blister formation, small wound, skin dyspigmentation, or rare scar following cryotherapy. Recommend Vaseline ointment to treated areas while healing.  Return if symptoms worsen or fail to improve.  IAsher Muir, CMA, am acting as scribe for Armida Sans, MD.  Documentation: I have reviewed the above documentation for accuracy and  completeness, and I agree with the above.  Armida Sans, MD

## 2022-09-28 NOTE — Patient Instructions (Addendum)
Cryotherapy Aftercare  Wash gently with soap and water everyday.   Apply Vaseline and Band-Aid daily until healed.   Viral Wart (HPV) Counseling  Discussed viral / HPV (Human Papilloma Virus) etiology and risk of spread /infectivity to other areas of body as well as to other people.  Multiple treatments and methods may be required to clear warts and it is possible treatment may not be successful.  Treatment risks include discoloration; scarring and there is still potential for wart recurrence.   Due to recent changes in healthcare laws, you may see results of your pathology and/or laboratory studies on MyChart before the doctors have had a chance to review them. We understand that in some cases there may be results that are confusing or concerning to you. Please understand that not all results are received at the same time and often the doctors may need to interpret multiple results in order to provide you with the best plan of care or course of treatment. Therefore, we ask that you please give [place] 2 business days to thoroughly review all your results before contacting the office for clarification. Should we see a critical lab result, you will be contacted sooner.   If You Need Anything After Your Visit  If you have any questions or concerns for your doctor, please call our main line at [phone] and press option 4 to reach your doctor's medical assistant. If no one answers, please leave a voicemail as directed and we will return your call as soon as possible. Messages left after 4 pm will be answered the following business day.   You may also send [place] a message via MyChart. We typically respond to MyChart messages within 1-2 business days.  For prescription refills, please ask your pharmacy to contact our office. Our fax number is [phone].  If you have an urgent issue when the clinic is closed that cannot wait until the next business day, you can page your doctor at the number below.     Please note that while we do our best to be available for urgent issues outside of office hours, we are not available 24/7.   If you have an urgent issue and are unable to reach [place], you may choose to seek medical care at your doctor's office, retail clinic, urgent care center, or emergency room.  If you have a medical emergency, please immediately call 911 or go to the emergency department.  Pager Numbers  - Dr. Gwen Pounds: [phone]  - Dr. Neale Burly: [phone]  - Dr. Roseanne Reno: [phone]  In the event of inclement weather, please call our main line at [phone] for an update on the status of any delays or closures.  Dermatology Medication Tips: Please keep the boxes that topical medications come in in order to help keep track of the instructions about where and how to use these. Pharmacies typically print the medication instructions only on the boxes and not directly on the medication tubes.   If your medication is too expensive, please contact our office at [phone] option 4 or send [place] a message through MyChart.   We are unable to tell what your co-pay for medications will be in advance as this is different depending on your insurance coverage. However, we may be able to find a substitute medication at lower cost or fill out paperwork to get insurance to cover a needed medication.   If a prior authorization is required to get your medication covered by your insurance company, please allow [place] 1-2  business days to complete this process.  Drug prices often vary depending on where the prescription is filled and some pharmacies may offer cheaper prices.  The website www.goodrx.com contains coupons for medications through different pharmacies. The prices here do not account for what the cost may be with help from insurance (it may be cheaper with your insurance), but the website can give you the price if you did not use any insurance.  - You can print the associated coupon and take  it with your prescription to the pharmacy.  - You may also stop by our office during regular business hours and pick up a GoodRx coupon card.  - If you need your prescription sent electronically to a different pharmacy, notify our office through [facility] or by phone at [phone] option 4.     Si Usted Necesita Algo Despus de Su Visita  Tambin puede enviarnos un mensaje a travs de Pharmacist, community. Por lo general respondemos a los mensajes de MyChart en el transcurso de 1 a 2 das hbiles.  Para renovar recetas, por favor pida a su farmacia que se ponga en contacto con nuestra oficina. Harland Dingwall de fax es [place] [phone].  Si tiene un asunto urgente cuando la clnica est cerrada y que no puede esperar hasta el siguiente da hbil, puede llamar/localizar a su doctor(a) al nmero que aparece a continuacin.   Por favor, tenga en cuenta que aunque hacemos todo lo posible para estar disponibles para asuntos urgentes fuera del horario de [place], no estamos disponibles las 24 horas del da, los 7 das de la [place].   Si tiene un problema urgente y no puede comunicarse con nosotros, puede optar por buscar atencin mdica  en el consultorio de su doctor(a), en una clnica privada, en un centro de atencin urgente o en una sala de emergencias.  Si tiene Engineering geologist, por favor llame inmediatamente al 911 o vaya a la sala de emergencias.  Nmeros de bper  - Dr. Nehemiah Massed: [phone]  - Dra. Moye: [phone]  - Dra. Nicole Kindred: [phone]  En caso de inclemencias del [place], por favor llame a Johnsie Kindred principal al [phone] para una actualizacin sobre el [place] de cualquier retraso o cierre.  Consejos para la medicacin en dermatologa: Por favor, guarde las cajas en las que vienen los medicamentos de uso tpico para ayudarle a seguir las instrucciones sobre dnde y cmo usarlos. Las farmacias generalmente imprimen las instrucciones del medicamento slo en las  cajas y no directamente en los tubos del [place].   Si su medicamento es muy caro, por favor, pngase en contacto con Zigmund Daniel llamando al [phone] y presione la opcin 4 o envenos un mensaje a travs de Pharmacist, community.   No podemos decirle cul ser su copago por los medicamentos por adelantado ya que esto es diferente dependiendo de la cobertura de su seguro. Sin embargo, es posible que podamos encontrar un medicamento sustituto a Electrical engineer un formulario para que el seguro cubra el medicamento que se considera necesario.   Si se requiere una autorizacin previa para que su compaa de seguros [place] su medicamento, por favor permtanos de 1 a 2 das hbiles para completar este proceso.  Los precios de los medicamentos varan con frecuencia dependiendo del Environmental consultant de dnde se surte la receta y alguna farmacias pueden ofrecer precios ms baratos.  El sitio web www.goodrx.com tiene cupones para medicamentos de Airline pilot. Los precios aqu no tienen en cuenta lo que podra costar con  la ayuda del seguro (puede ser ms barato con su seguro), pero el sitio web puede darle el precio si no Field seismologist.  - Puede imprimir el cupn correspondiente y llevarlo con su receta a la farmacia.  - Tambin puede pasar por nuestra oficina durante el horario de atencin regular y Charity fundraiser una tarjeta de cupones de GoodRx.  - Si necesita que su receta se enve electrnicamente a una farmacia diferente, informe a nuestra oficina a travs de MyChart de [place] o por telfono llamando al [phone] y presione la opcin 4.

## 2022-10-08 ENCOUNTER — Other Ambulatory Visit: Payer: Self-pay

## 2022-10-08 ENCOUNTER — Ambulatory Visit: Payer: 59 | Admitting: Dermatology

## 2022-10-08 DIAGNOSIS — F429 Obsessive-compulsive disorder, unspecified: Secondary | ICD-10-CM | POA: Diagnosis not present

## 2022-10-08 DIAGNOSIS — F3340 Major depressive disorder, recurrent, in remission, unspecified: Secondary | ICD-10-CM | POA: Diagnosis not present

## 2022-10-08 DIAGNOSIS — F411 Generalized anxiety disorder: Secondary | ICD-10-CM | POA: Diagnosis not present

## 2022-10-08 MED ORDER — BUPROPION HCL ER (SR) 100 MG PO TB12
100.0000 mg | ORAL_TABLET | Freq: Two times a day (BID) | ORAL | 0 refills | Status: AC
  Filled 2022-10-08: qty 180, 90d supply, fill #0

## 2022-10-08 MED ORDER — SERTRALINE HCL 100 MG PO TABS
200.0000 mg | ORAL_TABLET | Freq: Every morning | ORAL | 0 refills | Status: AC
  Filled 2022-10-08: qty 180, 90d supply, fill #0

## 2022-10-12 ENCOUNTER — Encounter: Payer: Self-pay | Admitting: Dermatology

## 2022-10-23 ENCOUNTER — Other Ambulatory Visit: Payer: Self-pay

## 2022-11-18 DIAGNOSIS — F411 Generalized anxiety disorder: Secondary | ICD-10-CM | POA: Diagnosis not present

## 2022-11-18 DIAGNOSIS — F429 Obsessive-compulsive disorder, unspecified: Secondary | ICD-10-CM | POA: Diagnosis not present

## 2022-11-18 DIAGNOSIS — F3340 Major depressive disorder, recurrent, in remission, unspecified: Secondary | ICD-10-CM | POA: Diagnosis not present

## 2023-01-08 ENCOUNTER — Other Ambulatory Visit: Payer: Self-pay

## 2023-01-08 MED ORDER — AMLODIPINE BESYLATE 5 MG PO TABS
5.0000 mg | ORAL_TABLET | Freq: Every day | ORAL | 3 refills | Status: AC
  Filled 2023-01-08: qty 90, 90d supply, fill #0

## 2023-07-15 ENCOUNTER — Other Ambulatory Visit: Payer: Self-pay

## 2023-07-15 MED ORDER — AMLODIPINE BESYLATE 10 MG PO TABS
10.0000 mg | ORAL_TABLET | Freq: Every day | ORAL | 3 refills | Status: AC
  Filled 2023-07-15: qty 90, 90d supply, fill #0

## 2023-07-27 ENCOUNTER — Other Ambulatory Visit: Payer: Self-pay

## 2024-01-31 ENCOUNTER — Other Ambulatory Visit: Payer: Self-pay

## 2024-01-31 MED ORDER — NA SULFATE-K SULFATE-MG SULF 17.5-3.13-1.6 GM/177ML PO SOLN
ORAL | 0 refills | Status: AC
  Filled 2024-01-31: qty 354, 1d supply, fill #0

## 2024-07-03 ENCOUNTER — Ambulatory Visit: Admit: 2024-07-03 | Admitting: Gastroenterology

## 2024-07-03 SURGERY — COLONOSCOPY
Anesthesia: General

## 2024-07-28 ENCOUNTER — Encounter: Admission: RE | Payer: Self-pay | Source: Home / Self Care

## 2024-07-28 ENCOUNTER — Ambulatory Visit: Admission: RE | Admit: 2024-07-28 | Payer: Self-pay | Admitting: Gastroenterology
